# Patient Record
Sex: Male | Born: 1935 | Race: White | Hispanic: No | Marital: Married | Smoking: Former smoker
Health system: Southern US, Community
[De-identification: ages and names within clinical notes are randomized; demographics above are authoritative.]

## PROBLEM LIST (undated history)

## (undated) DIAGNOSIS — E785 Hyperlipidemia, unspecified: Secondary | ICD-10-CM

## (undated) DIAGNOSIS — S42213A Unspecified displaced fracture of surgical neck of unspecified humerus, initial encounter for closed fracture: Secondary | ICD-10-CM

## (undated) DIAGNOSIS — F0390 Unspecified dementia without behavioral disturbance: Secondary | ICD-10-CM

## (undated) DIAGNOSIS — C61 Malignant neoplasm of prostate: Secondary | ICD-10-CM

## (undated) DIAGNOSIS — N200 Calculus of kidney: Secondary | ICD-10-CM

## (undated) DIAGNOSIS — I2510 Atherosclerotic heart disease of native coronary artery without angina pectoris: Secondary | ICD-10-CM

## (undated) DIAGNOSIS — G309 Alzheimer's disease, unspecified: Secondary | ICD-10-CM

## (undated) DIAGNOSIS — I509 Heart failure, unspecified: Secondary | ICD-10-CM

## (undated) DIAGNOSIS — S7291XA Unspecified fracture of right femur, initial encounter for closed fracture: Secondary | ICD-10-CM

## (undated) DIAGNOSIS — F0280 Dementia in other diseases classified elsewhere without behavioral disturbance: Secondary | ICD-10-CM

## (undated) DIAGNOSIS — D649 Anemia, unspecified: Secondary | ICD-10-CM

## (undated) DIAGNOSIS — E164 Increased secretion of gastrin: Secondary | ICD-10-CM

## (undated) DIAGNOSIS — I10 Essential (primary) hypertension: Secondary | ICD-10-CM

## (undated) DIAGNOSIS — Z950 Presence of cardiac pacemaker: Secondary | ICD-10-CM

## (undated) HISTORY — DX: Hyperlipidemia, unspecified: E78.5

## (undated) HISTORY — DX: Anemia, unspecified: D64.9

## (undated) HISTORY — DX: Heart failure, unspecified: I50.9

## (undated) HISTORY — DX: Increased secretion of gastrin: E16.4

## (undated) HISTORY — PX: TONSILLECTOMY: SUR1361

## (undated) HISTORY — DX: Atherosclerotic heart disease of native coronary artery without angina pectoris: I25.10

## (undated) HISTORY — PX: JOINT REPLACEMENT: SHX530

## (undated) HISTORY — PX: EYE SURGERY: SHX253

## (undated) HISTORY — PX: HEMORRHOID SURGERY: SHX153

## (undated) HISTORY — DX: Malignant neoplasm of prostate: C61

## (undated) HISTORY — DX: Unspecified displaced fracture of surgical neck of unspecified humerus, initial encounter for closed fracture: S42.213A

## (undated) HISTORY — DX: Unspecified dementia, unspecified severity, without behavioral disturbance, psychotic disturbance, mood disturbance, and anxiety: F03.90

## (undated) HISTORY — PX: CATARACT EXTRACTION, BILATERAL: SHX1313

## (undated) HISTORY — PX: HERNIA REPAIR: SHX51

---

## 1989-05-10 HISTORY — PX: CORONARY ANGIOPLASTY: SHX604

## 1997-05-10 HISTORY — PX: CORONARY ARTERY BYPASS GRAFT: SHX141

## 1998-02-17 ENCOUNTER — Inpatient Hospital Stay (HOSPITAL_COMMUNITY): Admission: EM | Admit: 1998-02-17 | Discharge: 1998-02-23 | Payer: Self-pay | Admitting: Emergency Medicine

## 1998-02-18 ENCOUNTER — Encounter: Payer: Self-pay | Admitting: Cardiovascular Disease

## 1998-02-19 ENCOUNTER — Encounter: Payer: Self-pay | Admitting: Thoracic Surgery (Cardiothoracic Vascular Surgery)

## 1998-02-20 ENCOUNTER — Encounter: Payer: Self-pay | Admitting: Thoracic Surgery (Cardiothoracic Vascular Surgery)

## 1998-02-21 ENCOUNTER — Encounter: Payer: Self-pay | Admitting: Thoracic Surgery (Cardiothoracic Vascular Surgery)

## 1998-04-23 ENCOUNTER — Encounter (HOSPITAL_COMMUNITY): Admission: RE | Admit: 1998-04-23 | Discharge: 1998-07-24 | Payer: Self-pay | Admitting: Cardiovascular Disease

## 2000-12-29 ENCOUNTER — Encounter (INDEPENDENT_AMBULATORY_CARE_PROVIDER_SITE_OTHER): Payer: Self-pay | Admitting: Specialist

## 2000-12-29 ENCOUNTER — Ambulatory Visit (HOSPITAL_COMMUNITY): Admission: RE | Admit: 2000-12-29 | Discharge: 2000-12-29 | Payer: Self-pay | Admitting: Gastroenterology

## 2002-10-13 ENCOUNTER — Emergency Department (HOSPITAL_COMMUNITY): Admission: EM | Admit: 2002-10-13 | Discharge: 2002-10-13 | Payer: Self-pay | Admitting: Emergency Medicine

## 2004-08-05 ENCOUNTER — Encounter (HOSPITAL_COMMUNITY): Admission: RE | Admit: 2004-08-05 | Discharge: 2004-11-03 | Payer: Self-pay | Admitting: Urology

## 2004-09-07 HISTORY — PX: OTHER SURGICAL HISTORY: SHX169

## 2004-09-24 ENCOUNTER — Ambulatory Visit (HOSPITAL_COMMUNITY): Admission: RE | Admit: 2004-09-24 | Discharge: 2004-09-24 | Payer: Self-pay | Admitting: Urology

## 2004-12-27 ENCOUNTER — Inpatient Hospital Stay (HOSPITAL_COMMUNITY): Admission: EM | Admit: 2004-12-27 | Discharge: 2005-01-01 | Payer: Self-pay | Admitting: Emergency Medicine

## 2004-12-27 ENCOUNTER — Ambulatory Visit: Payer: Self-pay | Admitting: Physical Medicine & Rehabilitation

## 2004-12-27 ENCOUNTER — Encounter (INDEPENDENT_AMBULATORY_CARE_PROVIDER_SITE_OTHER): Payer: Self-pay | Admitting: Specialist

## 2005-05-10 DIAGNOSIS — F0280 Dementia in other diseases classified elsewhere without behavioral disturbance: Secondary | ICD-10-CM

## 2005-05-10 HISTORY — DX: Dementia in other diseases classified elsewhere, unspecified severity, without behavioral disturbance, psychotic disturbance, mood disturbance, and anxiety: F02.80

## 2006-06-10 HISTORY — PX: CARDIAC CATHETERIZATION: SHX172

## 2006-07-06 ENCOUNTER — Ambulatory Visit (HOSPITAL_COMMUNITY): Admission: RE | Admit: 2006-07-06 | Discharge: 2006-07-06 | Payer: Self-pay | Admitting: Urology

## 2007-01-09 ENCOUNTER — Inpatient Hospital Stay (HOSPITAL_COMMUNITY): Admission: EM | Admit: 2007-01-09 | Discharge: 2007-01-13 | Payer: Self-pay | Admitting: Emergency Medicine

## 2007-01-09 HISTORY — PX: INSERT / REPLACE / REMOVE PACEMAKER: SUR710

## 2007-01-10 DIAGNOSIS — Z950 Presence of cardiac pacemaker: Secondary | ICD-10-CM

## 2007-01-10 HISTORY — DX: Presence of cardiac pacemaker: Z95.0

## 2007-11-07 HISTORY — PX: NM MYOCAR PERF WALL MOTION: HXRAD629

## 2010-05-08 ENCOUNTER — Observation Stay (HOSPITAL_COMMUNITY)
Admission: EM | Admit: 2010-05-08 | Discharge: 2010-05-09 | Payer: Self-pay | Source: Home / Self Care | Attending: Cardiovascular Disease | Admitting: Cardiovascular Disease

## 2010-06-05 NOTE — Discharge Summary (Signed)
  NAMEVINCE, AINSLEY NO.:  [id number]  MEDICAL RECORD NO.:  [id number]          PATIENT TYPE:  OBV  LOCATION:  2010                         FACILITY:  [facility]  PHYSICIAN:  Richard A. Alanda Amass, M.D.DATE OF BIRTH:  01/23/1919  DATE OF ADMISSION:  05/08/2010 DATE OF DISCHARGE:  05/09/2010                              DISCHARGE SUMMARY   DISCHARGE DIAGNOSES: 1. Dizziness of unclear etiology. 2. Known coronary artery disease with remote DMI in 1991, bypass     grafting in 1999, catheterization in September 2008, plan is for     medical therapy. 3. Known ischemic cardiomyopathy with an ejection fraction of 35% in     September 2008. 4. Complete heart block and bradycardia, status post Medtronic     pacemaker implant in September 2008. 5. History of prostate cancer. 6. Dementia.  HOSPITAL COURSE:  The patient is a pleasant 77 year old male followed by Dr. Alanda Amass.  He has coronary artery disease as described above.  In September 2008, e had a pacemaker and a cath.  He has actually done quite well and not been hospitalized since then.  He was at home and had a swimmy-headed episode.  There are no specific complaints, but his family was concern is that it was the same reason he came in September 2008.  He also admitted he has had a fall or two at home.  He is admitted to the emergency room, CT scan of his head was obtained as well as enzymes.  These were all negative.  By that time we were asked to see him at 11:30 p.m., it was decided to be best to just keep him overnight for observation and check his pacemaker in the morning.  His enzymes have been negative and has not had any further episodes.  His blood pressure is stable.  We will check his pacemaker today and discharge him later if that checks out okay.  He can follow up with Dr. Alanda Amass in a couple of weeks.  LABORATORY DATA:  White count 6.0, hemoglobin 13, hematocrit 38.7, and platelets 192.   Troponin negative x2.  Sodium 140, potassium 4.4, BUN 15, and creatinine 1.2.  LFTs were normal.  TSH is 2.3.  INR 0.9. Urinalysis unremarkable.  Chest x-ray shows no acute process.  CT shows no acute intracranial abnormality with mild small vessel change.  EKG shows paced rhythm.  DISPOSITION:  The patient will be discharged later today pending his pacemaker evaluation.     Abelino Derrick, P.A.   ______________________________ Pearletha Furl Alanda Amass, M.D.   Lenard Lance  D:  05/09/2010  T:  05/10/2010  Job:  [id number]  Electronically Signed by Corine Shelter P.A. on 05/13/2010 04:02:03 PM Electronically Signed by Susa Griffins M.D. on 06/03/2010 12:47:11 PM

## 2010-06-05 NOTE — H&P (Signed)
NAMEAKIO, HUDNALL NO.:  [id number]  MEDICAL RECORD NO.:  [id number]          PATIENT TYPE:  EMS  LOCATION:  MAJO                         FACILITY:  [facility]  PHYSICIAN:  Shenelle Klas A. Alanda Amass, M.D.DATE OF BIRTH:  1919-01-26  DATE OF ADMISSION:  05/08/2010 DATE OF DISCHARGE:                             HISTORY & PHYSICAL   CHIEF COMPLAINT:  Dizziness.  HISTORY OF PRESENT ILLNESS:  Mr. Mussa is an 77 year old male followed by Dr. Alanda Amass with a history of coronary disease.  He had a DMI treated with an angioplasty at [facility] in 1991.  He had bypass grafting in 1999.  He was admitted in September 2008 and catheterized.  At that time he had an occluded vein graft to the diagonal, patent LIMA to the LAD, and a patent SVG to the RCA.  His EF was 35% at that time.  He had dizziness and transient AV block during that admission and underwent a Medtronic pacemaker implant.  He has not been hospitalized since.  His family says he has had a couple of falls at home recently.  This evening his family said he was "swimmy-headed." They brought him to the emergency room for further evaluation.  He has not had chest pain, palpitations, or frank syncope.  He is admitted now for overnight observation and to evaluate his pacemaker.  MEDICATIONS:  His home medications include the following: 1. Aspirin daily. 2. Fish oil daily. 3. Metoprolol 12.5 mg b.i.d. 4. Namenda 10 mg a day. 5. Nexium 40 mg a day. 6. Zocor 40 mg a day. 7. Aldactone 25 mg a day.  ALLERGIES:  He has no known drug allergies.  PAST MEDICAL/SURGICAL HISTORY:  His past medical history is remarkable for coronary disease, as described above, as well as a pacemaker implant in September 2008, as described above.  He has a remote history of prostate cancer.  He has dementia and is an unreliable historian.  He has had a previous left total hip replacement in August 2006.  SOCIAL HISTORY:  He is  married.  He has 3 children and 8 grandchildren. He is living at home with his family.  FAMILY HISTORY:  Unremarkable.  REVIEW OF SYSTEMS:  Essentially unremarkable except for as noted above. The family says he has not been ill lately, and there have been no obvious fever or chills.  He has not had melena or seizures.  PHYSICAL EXAM:  VITAL SIGNS:  In the emergency room blood pressure is 155/95, pulse 70 and paced, respirations 12, temperature 98.7. GENERAL:  He is a well-developed, well-nourished elderly male, in no acute distress. HEENT:  Normocephalic.  Extraocular movements are intact.  Sclerae are nonicteric.  He wears glasses. NECK:  Without JVD or bruit. CHEST:  Clear to auscultation and percussion. CARDIAC:  Reveals regular rate and rhythm.  He does have a 2/6 systolic murmur, heard loudest along the left sternal border. ABDOMEN:  Abdomen is not tender, not distended. EXTREMITIES:  Without obvious edema.  Distal pulses are faint, but intact. NEURO:  Exam is grossly intact.  His strength is equal and symmetrical. SKIN:  Skin is cool and dry.  LABORATORY DATA:  White count 5.8, hemoglobin 12.7, hematocrit 38.5, platelets 187,000.  Sodium 139, potassium 4.3, BUN 16, creatinine 1.24, INR 0.9.  Troponins are negative x2.  Urinalysis is unremarkable.  Head CT is negative.  His EKG shows a paced rhythm.  IMPRESSION: 1. Dizziness. 2. Known coronary disease with remote DMI in 1991, bypass in 1999,     catheterization in September 2008, showing a vein graft to the     diagonal to be occluded, but a patent left internal mammary artery     (LIMA) to the left anterior descending artery (LAD), and a patent     vein graft to the right. 3. Known ischemic cardiomyopathy, his last ejection fraction was 35%     in September 2008. 4. Complete heart block and bradycardia status post Medtronic     pacemaker implant in September 2008. 5. Remote prostate cancer. 6. Dementia.  PLAN:  The  patient will be admitted for observation.  We will interrogate his pacemaker in the morning.     Abelino Derrick, P.A.   ______________________________ Pearletha Furl Alanda Amass, M.D.    Lenard Lance  D:  05/08/2010  T:  05/08/2010  Job:  [id number]  Electronically Signed by Corine Shelter P.A. on 05/13/2010 04:01:56 PM Electronically Signed by Susa Griffins M.D. on 06/03/2010 12:47:13 PM

## 2010-07-20 LAB — DIFFERENTIAL
Basophils Absolute: 0 10*3/uL (ref 0.0–0.1)
Eosinophils Relative: 4 % (ref 0–5)
Lymphocytes Relative: 25 % (ref 12–46)
Lymphs Abs: 1.2 10*3/uL (ref 0.7–4.0)
Monocytes Absolute: 0.6 10*3/uL (ref 0.1–1.0)
Monocytes Absolute: 0.6 10*3/uL (ref 0.1–1.0)
Monocytes Relative: 10 % (ref 3–12)
Neutro Abs: 3.7 10*3/uL (ref 1.7–7.7)
Neutro Abs: 3.8 10*3/uL (ref 1.7–7.7)
Neutrophils Relative %: 61 % (ref 43–77)
Neutrophils Relative %: 66 % (ref 43–77)

## 2010-07-20 LAB — URINE CULTURE: Culture: NO GROWTH

## 2010-07-20 LAB — COMPREHENSIVE METABOLIC PANEL
AST: 17 U/L (ref 0–37)
Albumin: 3.4 g/dL — ABNORMAL LOW (ref 3.5–5.2)
Alkaline Phosphatase: 68 U/L (ref 39–117)
BUN: 15 mg/dL (ref 6–23)
BUN: 16 mg/dL (ref 6–23)
CO2: 27 mEq/L (ref 19–32)
Calcium: 8.8 mg/dL (ref 8.4–10.5)
Calcium: 9.2 mg/dL (ref 8.4–10.5)
Chloride: 107 mEq/L (ref 96–112)
Creatinine, Ser: 1.24 mg/dL (ref 0.4–1.5)
GFR calc Af Amer: >60 mL/min (ref >60)
GFR calc non Af Amer: 55 mL/min — ABNORMAL LOW (ref >60)
Glucose, Bld: 113 mg/dL — ABNORMAL HIGH (ref 70–99)
Glucose, Bld: 113 mg/dL — ABNORMAL HIGH (ref 70–99)
Potassium: 4.4 mEq/L (ref 3.5–5.1)
Sodium: 140 mEq/L (ref 135–145)
Total Protein: 6 g/dL (ref 6.0–8.3)

## 2010-07-20 LAB — CARDIAC PANEL(CRET KIN+CKTOT+MB+TROPI)
CK, MB: 1.4 ng/mL (ref 0.3–4.0)
Relative Index: INVALID (ref 0.0–2.5)
Troponin I: 0.01 ng/mL (ref 0.00–0.06)

## 2010-07-20 LAB — URINALYSIS, ROUTINE W REFLEX MICROSCOPIC
Bilirubin Urine: NEGATIVE
Nitrite: NEGATIVE
Specific Gravity, Urine: 1.017 (ref 1.005–1.030)
pH: 6.5 (ref 5.0–8.0)

## 2010-07-20 LAB — POCT CARDIAC MARKERS
CKMB, poc: <1 ng/mL — ABNORMAL LOW (ref 1.0–8.0)
CKMB, poc: <1 ng/mL — ABNORMAL LOW (ref 1.0–8.0)

## 2010-07-20 LAB — CBC
HCT: 38.5 % — ABNORMAL LOW (ref 39.0–52.0)
Hemoglobin: 12.7 g/dL — ABNORMAL LOW (ref 13.0–17.0)
Hemoglobin: 13 g/dL (ref 13.0–17.0)
MCHC: 33.6 g/dL (ref 30.0–36.0)
MCV: 93.7 fL (ref 78.0–100.0)
MCV: 94.1 fL (ref 78.0–100.0)
Platelets: 187 10*3/uL (ref 150–400)
RBC: 4.09 MIL/uL — ABNORMAL LOW (ref 4.22–5.81)
RDW: 13.4 % (ref 11.5–15.5)

## 2010-07-20 LAB — PROTIME-INR
INR: 0.93 (ref 0.00–1.49)
Prothrombin Time: 12.7 seconds (ref 11.6–15.2)

## 2010-07-20 LAB — APTT: aPTT: 31 seconds (ref 24–37)

## 2010-07-25 ENCOUNTER — Emergency Department (HOSPITAL_COMMUNITY): Payer: Medicare Other

## 2010-07-25 ENCOUNTER — Inpatient Hospital Stay (HOSPITAL_COMMUNITY)
Admission: EM | Admit: 2010-07-25 | Discharge: 2010-07-28 | DRG: 470 | Disposition: A | Discharge status: Skilled Nursing Facility | Payer: Medicare Other | Attending: Internal Medicine | Admitting: Internal Medicine

## 2010-07-25 DIAGNOSIS — K219 Gastro-esophageal reflux disease without esophagitis: Secondary | ICD-10-CM | POA: Diagnosis present

## 2010-07-25 DIAGNOSIS — M1990 Unspecified osteoarthritis, unspecified site: Secondary | ICD-10-CM | POA: Diagnosis present

## 2010-07-25 DIAGNOSIS — I10 Essential (primary) hypertension: Secondary | ICD-10-CM | POA: Diagnosis present

## 2010-07-25 DIAGNOSIS — Y92009 Unspecified place in unspecified non-institutional (private) residence as the place of occurrence of the external cause: Secondary | ICD-10-CM

## 2010-07-25 DIAGNOSIS — E785 Hyperlipidemia, unspecified: Secondary | ICD-10-CM | POA: Diagnosis present

## 2010-07-25 DIAGNOSIS — W19XXXA Unspecified fall, initial encounter: Secondary | ICD-10-CM | POA: Diagnosis present

## 2010-07-25 DIAGNOSIS — Z8546 Personal history of malignant neoplasm of prostate: Secondary | ICD-10-CM

## 2010-07-25 DIAGNOSIS — Z01818 Encounter for other preprocedural examination: Secondary | ICD-10-CM

## 2010-07-25 DIAGNOSIS — I509 Heart failure, unspecified: Secondary | ICD-10-CM | POA: Diagnosis present

## 2010-07-25 DIAGNOSIS — I252 Old myocardial infarction: Secondary | ICD-10-CM

## 2010-07-25 DIAGNOSIS — I5022 Chronic systolic (congestive) heart failure: Secondary | ICD-10-CM | POA: Diagnosis present

## 2010-07-25 DIAGNOSIS — Z7982 Long term (current) use of aspirin: Secondary | ICD-10-CM

## 2010-07-25 DIAGNOSIS — F0390 Unspecified dementia without behavioral disturbance: Secondary | ICD-10-CM | POA: Diagnosis present

## 2010-07-25 DIAGNOSIS — I2510 Atherosclerotic heart disease of native coronary artery without angina pectoris: Secondary | ICD-10-CM | POA: Diagnosis present

## 2010-07-25 DIAGNOSIS — E164 Increased secretion of gastrin: Secondary | ICD-10-CM | POA: Diagnosis present

## 2010-07-25 DIAGNOSIS — Z9181 History of falling: Secondary | ICD-10-CM

## 2010-07-25 DIAGNOSIS — Z950 Presence of cardiac pacemaker: Secondary | ICD-10-CM

## 2010-07-25 DIAGNOSIS — S72009A Fracture of unspecified part of neck of unspecified femur, initial encounter for closed fracture: Principal | ICD-10-CM | POA: Diagnosis present

## 2010-07-25 DIAGNOSIS — Z951 Presence of aortocoronary bypass graft: Secondary | ICD-10-CM

## 2010-07-25 HISTORY — PX: OTHER SURGICAL HISTORY: SHX169

## 2010-07-25 LAB — CBC
MCH: 32.6 pg (ref 26.0–34.0)
MCHC: 35 g/dL (ref 30.0–36.0)
MCV: 93.1 fL (ref 78.0–100.0)
Platelets: 160 10*3/uL (ref 150–400)
RDW: 13.6 % (ref 11.5–15.5)

## 2010-07-25 LAB — POCT I-STAT, CHEM 8
Calcium, Ion: 1.15 mmol/L (ref 1.12–1.32)
Creatinine, Ser: 1.2 mg/dL (ref 0.4–1.5)
Glucose, Bld: 124 mg/dL — ABNORMAL HIGH (ref 70–99)
Hemoglobin: 14.6 g/dL (ref 13.0–17.0)
Sodium: 139 mEq/L (ref 135–145)
TCO2: 25 mmol/L (ref 0–100)

## 2010-07-25 LAB — URINALYSIS, ROUTINE W REFLEX MICROSCOPIC
Ketones, ur: 15 mg/dL — AB
Nitrite: NEGATIVE
Specific Gravity, Urine: 1.014 (ref 1.005–1.030)
Urobilinogen, UA: 1 mg/dL (ref 0.0–1.0)
pH: 6.5 (ref 5.0–8.0)

## 2010-07-25 LAB — POCT CARDIAC MARKERS

## 2010-07-25 LAB — MRSA PCR SCREENING: MRSA by PCR: NEGATIVE

## 2010-07-25 LAB — TYPE AND SCREEN

## 2010-07-25 LAB — ABO/RH: ABO/RH(D): A POS

## 2010-07-25 LAB — PROTIME-INR: INR: 0.94 (ref 0.00–1.49)

## 2010-07-26 LAB — CBC
HCT: 34.6 % — ABNORMAL LOW (ref 39.0–52.0)
MCHC: 34.1 g/dL (ref 30.0–36.0)
MCV: 93.8 fL (ref 78.0–100.0)
Platelets: 149 10*3/uL — ABNORMAL LOW (ref 150–400)
RDW: 13.7 % (ref 11.5–15.5)
WBC: 11.4 10*3/uL — ABNORMAL HIGH (ref 4.0–10.5)

## 2010-07-26 LAB — DIFFERENTIAL
Eosinophils Absolute: 0 10*3/uL (ref 0.0–0.7)
Eosinophils Relative: 0 % (ref 0–5)
Lymphocytes Relative: 6 % — ABNORMAL LOW (ref 12–46)
Lymphs Abs: 0.7 10*3/uL (ref 0.7–4.0)
Monocytes Absolute: 1.2 10*3/uL — ABNORMAL HIGH (ref 0.1–1.0)

## 2010-07-26 LAB — COMPREHENSIVE METABOLIC PANEL
Albumin: 3 g/dL — ABNORMAL LOW (ref 3.5–5.2)
BUN: 12 mg/dL (ref 6–23)
Calcium: 8.5 mg/dL (ref 8.4–10.5)
Creatinine, Ser: 0.97 mg/dL (ref 0.4–1.5)
Glucose, Bld: 110 mg/dL — ABNORMAL HIGH (ref 70–99)
Potassium: 4 mEq/L (ref 3.5–5.1)
Total Protein: 5.3 g/dL — ABNORMAL LOW (ref 6.0–8.3)

## 2010-07-26 LAB — MAGNESIUM: Magnesium: 1.8 mg/dL (ref 1.5–2.5)

## 2010-07-26 LAB — PHOSPHORUS: Phosphorus: 3.3 mg/dL (ref 2.3–4.6)

## 2010-07-27 LAB — CBC
HCT: 30.9 % — ABNORMAL LOW (ref 39.0–52.0)
Hemoglobin: 10.6 g/dL — ABNORMAL LOW (ref 13.0–17.0)
MCHC: 34.3 g/dL (ref 30.0–36.0)
RBC: 3.3 MIL/uL — ABNORMAL LOW (ref 4.22–5.81)
WBC: 9.5 10*3/uL (ref 4.0–10.5)

## 2010-07-27 LAB — BASIC METABOLIC PANEL
CO2: 21 mEq/L (ref 19–32)
Calcium: 8.2 mg/dL — ABNORMAL LOW (ref 8.4–10.5)
Chloride: 105 mEq/L (ref 96–112)
GFR calc Af Amer: >60 mL/min (ref >60)
Glucose, Bld: 107 mg/dL — ABNORMAL HIGH (ref 70–99)
Potassium: 4.5 mEq/L (ref 3.5–5.1)
Sodium: 133 mEq/L — ABNORMAL LOW (ref 135–145)

## 2010-07-28 LAB — BASIC METABOLIC PANEL
CO2: 25 mEq/L (ref 19–32)
Chloride: 104 mEq/L (ref 96–112)
Creatinine, Ser: 1.07 mg/dL (ref 0.4–1.5)
GFR calc Af Amer: >60 mL/min (ref >60)
Glucose, Bld: 99 mg/dL (ref 70–99)

## 2010-07-28 LAB — CBC
HCT: 31.6 % — ABNORMAL LOW (ref 39.0–52.0)
Hemoglobin: 10.7 g/dL — ABNORMAL LOW (ref 13.0–17.0)
MCH: 31.5 pg (ref 26.0–34.0)
RBC: 3.4 MIL/uL — ABNORMAL LOW (ref 4.22–5.81)

## 2010-07-30 NOTE — Discharge Summary (Signed)
Douglas Tucker, COCHRANE NO.:  [id number]  MEDICAL RECORD NO.:  [id number]           PATIENT TYPE:  I  LOCATION:  4703                         FACILITY:  [facility]  PHYSICIAN:  Jeoffrey Massed, MD    DATE OF BIRTH:  1919/01/30  DATE OF ADMISSION:  07/25/2010 DATE OF DISCHARGE:                        DISCHARGE SUMMARY - REFERRING   PRIMARY CARE PRACTITIONER:  Kenlee Maler in [place].  PRIMARY CARDIOLOGIST:  Richard A. Alanda Amass, MD of [place].  PRIMARY DISCHARGE DIAGNOSIS:  Right displaced femoral neck fracture, status post right hip hemiarthroplasty.  SECONDARY DISCHARGE DIAGNOSES: 1. Systolic heart failure, compensated. 2. Hypertension. 3. Dementia. 4. History of prostate CA, status post seed implant. 5. Gastroesophageal reflux disease. 6. History of Zollinger-Ellison syndrome. 7. History of pacemaker for complete heart block.  DISCHARGE MEDICATIONS: 1. Ferrous sulfate 325 mg 1 tablet p.o. 3 times a day. 2. Norco 5/325 1/2-1 tablet p.o. q.6 h p.r.n. 3. Robaxin 500 mg 1 tablet p.o. q.6 h p.r.n. 4. Aspirin 81 mg 1 tablet p.o. daily. 5. Fish oil over-the-counter 1 capsule p.o. twice daily. 6. Metoprolol 25 mg 1 tablet p.o. twice daily. 7. Namenda 10 mg 1 tablet p.o. daily. 8. Nexium 40 mg 1 capsule p.o. daily. 9. Zocor 40 mg 1 tablet p.o. daily. 10.Aldactone 25 mg half a tab p.o. every morning.  CONSULTATIONS: 1. [facility]. 2. Almedia Balls. Ranell Patrick, MD from orthopedics.  PERTINENT RADIOLOGICAL STUDIES: 1. CT of the right hip done on July 25, 2010 showed a displaced right     femoral neck fracture. 2. CT of the head showed no acute intracranial abnormality. 3. CT of the cervical spine showed cervical spondylosis with no acute     abnormality.  PERTINENT LABORATORY DATA: 1. Last CBC shows WBC of 8.5, hemoglobin of 10.7 and a platelet count     of 138. 2. Chemistry shows a sodium of 132, potassium of 4.8, chloride of 104,  bicarb of 25, glucose of 99, BUN of 15, creatinine of 1.07 and     calcium of 8.4.  BRIEF HOSPITAL COURSE: 1. Right hip fracture, status post right hip hemiarthroplasty.  The     patient presented with a mechanical fall which was not a syncopal     episode.  Given his multiple medical comorbidities, the patient was     admitted to the hospital service.  Cardiology was consulted for     preoperative consultation, and they thought that the patient was     currently optimized to undergo surgery.  Subsequently, the patient     underwent surgery on July 25, 2010 and did well postoperatively.     Current plans are to use Norco for pain control on a p.r.n. basis     and the patient to be transferred to a skilled nursing facility for     further rehab.  Currently, we are awaiting a bed. 2. Congestive heart failure.  This is compensated.  The patient will     be continued on his metoprolol and Aldactone on discharge. 3. History of gastroesophageal reflux disease and Zollinger-Ellison     syndrome.  The  patient will be continued on Nexium on discharge. 4. History of coronary artery disease.  The patient will continue     simvastatin, aspirin along with metoprolol. 5. Dementia.  This is at baseline.  He will continue with Namenda.  DISPOSITION:  Currently, we are awaiting a bed to be available at a skilled nursing facility for the patient to be discharged there.  FOLLOWUP INSTRUCTIONS: 1. The patient to follow up with his primary care practitioner within     1 week upon discharge from the skilled nursing facility. 2. The patient to follow up with [facility]     within 1-2 weeks upon discharge. 3. The patient to follow up with Orthopedics within 1-2 weeks upon     discharge.  Total time spent 45 minutes.     Jeoffrey Massed, MD     SG/MEDQ  D:  07/28/2010  T:  07/28/2010  Job:  [id number]  cc:   Almedia Balls. Ranell Patrick, M.D. Jayke Slatosky Richard A. Alanda Amass,  M.D.  Electronically Signed by Jeoffrey Massed  on 07/30/2010 08:38:18 PM

## 2010-08-11 NOTE — H&P (Signed)
[name], Tucker NO.:  [id number]  MEDICAL RECORD NO.:  [id number]           PATIENT TYPE:  E  LOCATION:  [facility]                         FACILITY:  [facility]  PHYSICIAN:  Rock Nephew, MD       DATE OF BIRTH:  Jul 04, 1918  DATE OF ADMISSION:  07/25/2010 DATE OF DISCHARGE:                             HISTORY & PHYSICAL   PRIMARY CARE PHYSICIAN:  Dr. Cheri Rous in [place].  PRIMARY CARDIOLOGIST:  Richard A. Alanda Amass, MD of [facility].  CHIEF COMPLAINT:  Fall, right hip fracture.  HISTORY OF PRESENT ILLNESS:  This is an 77 year old male with a history of coronary artery disease, pacemaker insertion for complete heart block, hypertension, dementia, prostate cancer, Zollinger-Ellison syndrome, and hyperlipidemia.  The patient's family is present and wife also, Douglas Tucker [phone], who is the healthcare power of attorney. The patient has been falling recently.  The patient fell at his home at 3 a.m. in the bathroom.  The patient and the family denied syncopal episode.  The patient has denied any chest pain or any shortness of breath.  The patient is alert, awake, and oriented to self and place. The patient currently is not in any pain.  He is resting comfortably in bed.  The patient has been seen at [facility], Dr. Royann Shivers in consultation.  PAST MEDICAL HISTORY: 1. Hypertension. 2. Prostate cancer.  Family reports that the patient has had     radioactive seeds. 3. Dementia. 4. GERD. 5. Zollinger-Ellison syndrome. 6. History of hypercholesterolemia. 7. History of MI. 8. Systolic CHF with ejection fraction of 45-50% on February 2011.  PAST SURGICAL HISTORY:  CABG and pacemaker for complete heart block.  SOCIAL HISTORY:  Nonsmoker, nondrinker.  No drug abuse.  The patient lives with his wife.  He ambulates with the help of a walker and cane.  ALLERGIES:  No known drug allergies.  REVIEW OF SYSTEMS:   Unable to obtain fully, however the patient denies any chest pain, any shortness of breath, any headaches, or any pain.  MEDICATIONS: 1. Metoprolol 25 mg p.o. b.i.d. 2. Nexium 40 mg p.o. daily. 3. Simvastatin 40 mg p.o. daily. 4. Fish oil, unknown how often. 5. Aspirin 81 mg p.o. daily. 6. Aldactone 12.5 mg p.o. daily. 7. Namenda 10 mg every other day daily.  PHYSICAL EXAMINATION:  VITAL SIGNS:  Temperature 97.8, blood pressure currently is 135/84, pulse is 73, respiratory rate is 20, and 95% saturation on room air. HEAD, EYES, EARS, NOSE AND THROAT:  Normocephalic and atraumatic. Pupils are equally round and reactive to light. CARDIOVASCULAR:  Regular rate and rhythm.  No murmurs or rubs. LUNGS:  Clear to auscultation bilaterally.  No wheezes or rhonchi. ABDOMEN:  Soft, nontender, nondistended.  Bowel sounds are positive.  No guarding or rebound tenderness. EXTREMITIES:  No lower extremity edema.  The patient had some right hip pain on palpation.  The patient is able to move all extremities. NEUROLOGIC:  He has no neurological deficits, recently moving the right leg is causing pain.  IMAGING DATA:  The patient's 12-lead EKG shows a wide complex  nonspecific rhythm that is labeled as Afib.  RADIOLOGICAL STUDIES:  The patient had a chest x-ray one-view, which shows stable hyperinflation of the lung without acute findings, right hip x-ray shows displaced right femoral neck fracture.  CT of the C- spine shows cervical spondylolysis, no acute bone abnormality.  CT of the head shows cervical spondylolysis, no acute bony abnormalities.  LABORATORY STUDIES:  WBC count 12.7, hemoglobin 14.6, hematocrit 43.0, MCV 93.1, and platelets 160.  INR 0.94.  Sodium 139, potassium 4.5, chloride 104, bicarbonate 25, BUN 15, creatinine 1.2.  Troponin-I point- of-care marker was 0.05.  Urinalysis; negative leukocyte esterase and negative nitrites.  IMPRESSION AND PLAN:  This is an 77 year old male  with multiple medical problems admitted for right hip femoral neck fracture.  1. Right hip femoral neck fracture.  The patient is seen in     consultation with [facility], Dr. Royann Shivers.     [facility] has determined the risks of     delaying surgery is greater than the risk of undergoing surgery.     The patient will get a preop beta-blocker prior to the surgery.     The patient will be n.p.o. except for meds.  I have spoke to Dr.     Malon Kindle, he will proceed with the surgery today. 2. History of pacemaker complete heart block.  The pacer is currently     being interrogated. 3. History of coronary artery disease.  Currently, the patient's     aspirin is going to be withheld.  The patient will be continued on     the beta-blocker. 4. History of systolic congestive heart failure, chronic.  We will     monitor the patient, we will get gentle IV fluids.  We will watch     the patient for volume overload.  The patient currently is     receiving low dose Aldactone.  The patient is not on an ACE     inhibitor right now. 5. History of Zollinger-Ellison syndrome and gastroesophageal reflux     disease.  The patient will be continued on Nexium. 6. History of hypertension.  The patient will be continued on home     antihypertensives except for Aldactone. 7. History of hyperlipidemia.  The patient will be continued on     statins. 8. DVT prophylaxis.  The patient will be placed on SCDs. 9. History of prostate cancer.  The patient has prostate cancer seeds.     The patient should follow up outpatient with [facility] to     figure out if these seeds need to be removed. 10.The patient is a full code.     Rock Nephew, MD     NH/MEDQ  D:  07/25/2010  T:  07/25/2010  Job:  [id number]  cc:   Cheri Rous Richard A. Alanda Amass, M.D.  Electronically Signed by Rock Nephew MD on 08/11/2010 05:59:42 PM

## 2010-08-12 NOTE — Op Note (Signed)
Douglas Tucker, Douglas Tucker NO.:  [id number]  MEDICAL RECORD NO.:  [id number]           PATIENT TYPE:  I  LOCATION:  3311                         FACILITY:  [facility]  PHYSICIAN:  Almedia Balls. Ranell Patrick, M.D. DATE OF BIRTH:  01/02/1919  DATE OF PROCEDURE:  07/25/2010 DATE OF DISCHARGE:                              OPERATIVE REPORT   PREOPERATIVE DIAGNOSIS:  Right displaced femoral neck fracture.  POSTOPERATIVE DIAGNOSIS:  Right displaced femoral neck fracture.  PROCEDURE PERFORMED:  Right hip hemiarthroplasty using DePuy Tri-Lock system.  ATTENDING SURGEON:  Almedia Balls. Ranell Patrick, M.D.  ASSISTANT:  Modesto Charon, [facility]  ANESTHESIA:  Spinal anesthesia was used.  ESTIMATED BLOOD LOSS:  150 mL.  FLUID REPLACEMENT:  750 mL.  URINE OUTPUT:  400 mL.  INSTRUMENT COUNTS:  Correct.  There were no complications.  Perioperative antibiotics were given.  INDICATIONS:  The patient is an 77 year old limited household ambulator, who sustained a ground-level fall.  The patient was unable to ambulate after falling and presented to the [facility] emergency room where x- rays demonstrated a displaced femoral neck fracture.  The patient has had a prior left hip hemiarthroplasty done years ago by Dr. Rennis Chris in our Practice, so family requested our Practice and a consult was made for Orthopedics for evaluation and treatment of the hip.  I discussed with the patient and the family options for treatment surgery being the best option to restore the patient's preinjury level of activity and independence.  The patient's family did consent to surgery.  Risks and benefits of surgery discussed.  Informed consent was obtained.  DESCRIPTION OF PROCEDURE:  After an adequate level of anesthesia was achieved, the patient was positioned in the left lateral decubitus position with the right hip up.  The right hip was sterilely prepped and draped in usual manner after padding the down leg and placing  an axillary roll and checking all neurovascular structures.  We then went ahead and sterilely prepped and draped the right hip and leg in the usual manner.  We did a Kocher-Langenbeck posterior approach to the hip starting at the vastus ridge and extending about 8-10 cm ending towards the gluteus maximus.  Dissection down through the subcutaneous tissues using Bovie.  The tensor fascia lata was divided in line with a skin incision.  Gluteus medius was retracted anteriorly.  The short external rotators were divided sharply off the posterior aspect of the femur using Bovie including piriformis.  We then entered the posterior hip capsule and divided that, the hip fracture was identified did not appear pathologic.  We went ahead and cut the femoral neck using the neck resection guide one fingerbreadth above the lesser trochanter.  At this point, we went ahead and removed the femoral head and sized it to a 52- mm diameter.  We removed the ligamentum teres and some anterior capsule of bone attached to it.  At this point, we thoroughly irrigated the socket.  We then prepared the femur first with a cookie cutter out laterally and then a star reamer for removing lateral bone.  We then used a canal finder and  then sequential broaching up to a size 9, a DePuy Tri-Lock Press-Fit stem.  We then with a size 9 in place with appropriate anteversion we went ahead and trialed with a 0 neck and a 52 head ball, reduced the hip, had symmetric leg lengths, very stable hip. No impingement in extension.  Nice flexion stability with internal rotation.  At this point, we went ahead and removed the trial components.  We inserted using a Press-Fit technique the Tri-Lock 9 stem with appropriate anteversion and impacted that into place.  We then went ahead and placed our 0 neck adapter and the 52 monopolar head and impacted that and placed the neck head assembly on the trunnion impacting that in position we then  reduced the hip and we are again happy with leg lengths and hip stability.  We thoroughly irrigated, closed the hip capsule with #1 Mersilene suture, and then we did a #1 Vicryl figure-of-eight interrupted closure of the tensor fascia lata. At this point, we went ahead and closed subcu with 2-0 Vicryl and 4-0 Monocryl for skin.  Steri-Strips and sterile dressing were applied.  The patient tolerated procedure well.     Almedia Balls. Ranell Patrick, M.D.     SRN/MEDQ  D:  07/25/2010  T:  07/26/2010  Job:  [id number]  Electronically Signed by Malon Kindle  on 08/12/2010 08:08:16 PM

## 2010-09-10 ENCOUNTER — Emergency Department (HOSPITAL_COMMUNITY): Payer: Medicare Other

## 2010-09-10 ENCOUNTER — Inpatient Hospital Stay (HOSPITAL_COMMUNITY)
Admission: EM | Admit: 2010-09-10 | Discharge: 2010-09-15 | DRG: 563 | Disposition: A | Discharge status: Skilled Nursing Facility | Payer: Medicare Other | Attending: Internal Medicine | Admitting: Internal Medicine

## 2010-09-10 DIAGNOSIS — W19XXXA Unspecified fall, initial encounter: Secondary | ICD-10-CM | POA: Diagnosis present

## 2010-09-10 DIAGNOSIS — D649 Anemia, unspecified: Secondary | ICD-10-CM | POA: Diagnosis present

## 2010-09-10 DIAGNOSIS — S42213A Unspecified displaced fracture of surgical neck of unspecified humerus, initial encounter for closed fracture: Principal | ICD-10-CM | POA: Diagnosis present

## 2010-09-10 DIAGNOSIS — E871 Hypo-osmolality and hyponatremia: Secondary | ICD-10-CM | POA: Diagnosis present

## 2010-09-10 DIAGNOSIS — N390 Urinary tract infection, site not specified: Secondary | ICD-10-CM | POA: Diagnosis present

## 2010-09-10 DIAGNOSIS — E785 Hyperlipidemia, unspecified: Secondary | ICD-10-CM | POA: Diagnosis present

## 2010-09-10 DIAGNOSIS — C61 Malignant neoplasm of prostate: Secondary | ICD-10-CM | POA: Diagnosis present

## 2010-09-10 DIAGNOSIS — A498 Other bacterial infections of unspecified site: Secondary | ICD-10-CM | POA: Diagnosis present

## 2010-09-10 DIAGNOSIS — R55 Syncope and collapse: Secondary | ICD-10-CM | POA: Diagnosis present

## 2010-09-10 DIAGNOSIS — I5022 Chronic systolic (congestive) heart failure: Secondary | ICD-10-CM | POA: Diagnosis present

## 2010-09-10 DIAGNOSIS — I509 Heart failure, unspecified: Secondary | ICD-10-CM | POA: Diagnosis present

## 2010-09-10 DIAGNOSIS — E164 Increased secretion of gastrin: Secondary | ICD-10-CM | POA: Diagnosis present

## 2010-09-10 LAB — COMPREHENSIVE METABOLIC PANEL
BUN: 20 mg/dL (ref 6–23)
Calcium: 9.1 mg/dL (ref 8.4–10.5)
Glucose, Bld: 94 mg/dL (ref 70–99)
Total Protein: 6 g/dL (ref 6.0–8.3)

## 2010-09-10 LAB — FERRITIN: Ferritin: 139 ng/mL (ref 22–322)

## 2010-09-10 LAB — CBC
HCT: 34.9 % — ABNORMAL LOW (ref 39.0–52.0)
MCHC: 34.1 g/dL (ref 30.0–36.0)
MCV: 94.1 fL (ref 78.0–100.0)
RDW: 14 % (ref 11.5–15.5)

## 2010-09-10 LAB — IRON AND TIBC
Iron: 89 ug/dL (ref 42–135)
UIBC: 193 ug/dL

## 2010-09-10 LAB — URINALYSIS, ROUTINE W REFLEX MICROSCOPIC
Glucose, UA: NEGATIVE mg/dL
Specific Gravity, Urine: 1.019 (ref 1.005–1.030)
pH: 5.5 (ref 5.0–8.0)

## 2010-09-10 LAB — TSH: TSH: 1.381 u[IU]/mL (ref 0.350–4.500)

## 2010-09-10 LAB — DIFFERENTIAL
Basophils Absolute: 0 10*3/uL (ref 0.0–0.1)
Eosinophils Absolute: 0.1 10*3/uL (ref 0.0–0.7)
Eosinophils Relative: 1 % (ref 0–5)
Lymphocytes Relative: 12 % (ref 12–46)
Lymphs Abs: 1 10*3/uL (ref 0.7–4.0)
Monocytes Absolute: 0.9 10*3/uL (ref 0.1–1.0)

## 2010-09-10 LAB — URINE MICROSCOPIC-ADD ON

## 2010-09-10 LAB — CARDIAC PANEL(CRET KIN+CKTOT+MB+TROPI): Troponin I: <0.3 ng/mL (ref <0.30)

## 2010-09-11 LAB — CBC
HCT: 34.1 % — ABNORMAL LOW (ref 39.0–52.0)
Hemoglobin: 11.4 g/dL — ABNORMAL LOW (ref 13.0–17.0)
MCV: 95.8 fL (ref 78.0–100.0)
RBC: 3.56 MIL/uL — ABNORMAL LOW (ref 4.22–5.81)
RDW: 14.2 % (ref 11.5–15.5)
WBC: 7.5 10*3/uL (ref 4.0–10.5)

## 2010-09-11 LAB — OSMOLALITY: Osmolality: 275 mOsm/kg (ref 275–300)

## 2010-09-11 LAB — COMPREHENSIVE METABOLIC PANEL
ALT: 8 U/L (ref 0–53)
AST: 13 U/L (ref 0–37)
Albumin: 2.8 g/dL — ABNORMAL LOW (ref 3.5–5.2)
Calcium: 8.9 mg/dL (ref 8.4–10.5)
GFR calc Af Amer: >60 mL/min (ref >60)
Sodium: 131 mEq/L — ABNORMAL LOW (ref 135–145)
Total Protein: 5.9 g/dL — ABNORMAL LOW (ref 6.0–8.3)

## 2010-09-11 LAB — DIFFERENTIAL
Eosinophils Relative: 2 % (ref 0–5)
Lymphocytes Relative: 12 % (ref 12–46)
Lymphs Abs: 0.9 10*3/uL (ref 0.7–4.0)

## 2010-09-11 LAB — CORTISOL: Cortisol, Plasma: 7.3 ug/dL

## 2010-09-11 LAB — CARDIAC PANEL(CRET KIN+CKTOT+MB+TROPI)
CK, MB: 1.7 ng/mL (ref 0.3–4.0)
CK, MB: 1.7 ng/mL (ref 0.3–4.0)
Total CK: 34 U/L (ref 7–232)

## 2010-09-11 LAB — APTT: aPTT: 36 seconds (ref 24–37)

## 2010-09-11 LAB — MAGNESIUM: Magnesium: 2 mg/dL (ref 1.5–2.5)

## 2010-09-11 LAB — PRO B NATRIURETIC PEPTIDE: Pro B Natriuretic peptide (BNP): 1005 pg/mL — ABNORMAL HIGH (ref 0–450)

## 2010-09-12 LAB — BASIC METABOLIC PANEL
BUN: 16 mg/dL (ref 6–23)
CO2: 24 mEq/L (ref 19–32)
Glucose, Bld: 108 mg/dL — ABNORMAL HIGH (ref 70–99)
Potassium: 3.9 mEq/L (ref 3.5–5.1)
Sodium: 131 mEq/L — ABNORMAL LOW (ref 135–145)

## 2010-09-13 LAB — URINE CULTURE: Culture  Setup Time: 201205040123

## 2010-09-16 NOTE — Discharge Summary (Addendum)
Douglas Tucker, Douglas Tucker NO.:  [id number]  MEDICAL RECORD NO.:  [id number]           PATIENT TYPE:  I  LOCATION:  1432                         FACILITY:  [facility]  PHYSICIAN:  Brendia Sacks, MD    DATE OF BIRTH:  03-Oct-1918  DATE OF ADMISSION:  09/10/2010 DATE OF DISCHARGE:  09/15/2010                              DISCHARGE SUMMARY   PRIMARY CARE PROVIDER:  Dr. Cheri Rous in [place].  CARDIOLOGIST:  Richard A. Alanda Amass, M.D. of [place].  DISCHARGE DIAGNOSES: 1. Left humeral neck fracture. 2. Urinary tract infection. 3. Dementia with mild acute delirium secondary to narcotics. 4. Status post fall. 5. History of chronic systolic heart failure. 6. Coronary artery disease. 7. Chronic anemia. 8. Hyperlipidemia. 9. Zollinger-Ellison syndrome. 10.Prostate cancer.  MEDICATIONS: 1. Aspirin 81 mg p.o. daily. 2. Calcium/vitamin D chewable one p.o. t.i.d. 3. Exelon 9.5 mg patch transdermally daily. 4. Ferrous sulfate 325 mg p.o. t.i.d. after meals. 5. Fish oil 1000 mg p.o. b.i.d. 6. Metoprolol tartrate 12.5 mg p.o. daily. 7. Midodrine 2.5 mg p.o. b.i.d. 8. Namenda 10 mg p.o. daily at bedtime. 9. Nexium 40 mg p.o. b.i.d. 10.Simvastatin 40 mg p.o. daily. 11.Sonata 10 mg p.o. daily at bedtime. 12.Tylenol 325 mg 2 tablets p.o. t.i.d. as needed for pain.  DIAGNOSTIC TESTS:  WBC is 8.5, hemoglobin 11.9, hematocrit 34.9, platelets 215.  Sodium 131, potassium 4.4, chloride 98, CO2 of 25, BUN 20, creatinine 1.01, glucose 94, albumin 3.3.  Urinalysis with moderate leukocytes, positive nitrites, cloudy in appearance, many bacteria, 11 to 20 WBCs.  Total CK 43, CK-MB 1.9, troponin I less than 0.30.  TSH 1.38.  Anemia panel yields iron of 89, TIBC 282, percent saturation 32. Vitamin B12 554, folate 12.8, ferritin 139, total CK of 37, CK-MB 1.7, troponin I less than 0.30.  Left shoulder x-ray yields a left humeral neck fracture.  CT of the head yields no  intracranial trauma.  Atrophy and microvascular disease.  CT of the cervical spine yields no evidence of cervical spine fracture.  Discussed the esthetic disease and facet hypertrophy.  CONSULTATIONS: 1. Dr. Simonne Come with Orthopedics. 2. [employer] pacemaker rep.  BRIEF SUMMARY:  Douglas Tucker is an 77 year old male with a history of coronary artery disease, permanent pacemaker for complete heart block, hypertension, dementia, who was brought by ambulance to the [facility] ED with a chief complaint of left shoulder and arm pain status post a fall.  The family reports that the patient had fallen the day before and has been compliant of left arm and shoulder pain ever since.  The patient had complained of dizziness prior to the fall but the fall was not witnessed.  The patient denies any loss of consciousness but reports that he does not remember the incident or exactly why he fell.  Workup in the emergency room yielded a urinary tract infection as well as the left humeral neck fracture.  The patient was admitted for further evaluation and treatment.  Of note, the patient had a right hip fracture in March of this year.  He went to [facility] once he was discharged  from the hospital.  He was subsequently discharged home with 24-hour home care and physical therapy.  HOSPITAL COURSE BY PROBLEMS: 1. Left humeral neck fracture.  Orthopedics consulted and recommended     a left upper extremity immobilizer.  Immobilizer is to be worn at     all times, only to be taken off for skin care.  There is to be no     range of motion for that shoulder. 2. Urinary tract infection.  Culture yields E coli.  The patient     completed 3 days of IV Rocephin. 3. Dementia with mild delirium secondary to narcotics.  The patient     experienced some delirium presumably secondary to narcotic pain     medicine.  Once narcotics were discontinued, the patient returned     to baseline mental  status.  At the time of discharge, the patient     is on Tylenol for pain and at his baseline mental status. 4. Status post fall.  The patient was evaluated by PT and OT.     Recommend SNF placement. 5. History of chronic systolic heart failure.  The patient had a 2-D     echo during this hospitalization which yielded an ejection fraction     of 50% to 55%.  The patient's beta blocker and spironolactone were     held at the time of admission.  His beta-blocker was eventually     restarted.  Spironolactone remains on hold secondary to risk of     hypotension.  This will need to be monitored and reevaluated by     primary care provider. 6. Coronary artery disease.  No chest pain.  The patient continued on     aspirin. 7. Chronic anemia, stable during this hospitalization.  Continue iron. 8. Hyperlipidemia.  Continue Zocor. 9. Zollinger-Ellison syndrome.  The patient's Nexium was continued. 10.History of prostate cancer status post seed implantation.  CODE STATUS:  Dr. Irene Limbo spoke to family at length about the patient's code status.  The patient remains a full code.  PHYSICAL EXAMINATION:  VITAL SIGNS:  Temperature 98.7, blood pressure 120/77, heart rate 66, respirations 18, sats 97% on room air. GENERAL:  Awake, alert, no acute distress. CV:  Regular rate and rhythm.  No murmur, gallop or rub.  No lower extremity edema. RESPIRATORY:  Normal effort.  Breath sounds clear to auscultation bilaterally.  No rhonchi, wheezes or rales. ABDOMEN:  Flat, soft, positive bowel sounds throughout, nontender to palpation. MUSCULOSKELETAL:  Moves all extremities except his left upper extremity which is in an immobilizer which is intact.  Fingers to that left hand are warm to touch.  Nailbed is pink with quick capillary refill.  LABORATORY DATA:  Sodium 131, potassium 3.9, chloride 99, CO2 of 24, BUN 16, creatinine 0.96, glucose 108.  DISCHARGE INSTRUCTIONS:  The patient being discharged to  facility for short-term rehab.  Left upper extremity immobilizer to remain intact, only to be removed for skin care.  There is to be no range of motion to that left shoulder.  DIET:  Heart healthy diet.  ACTIVITY:  Ambulates with assistance only.  Fall precautions.  Will need PT at facility.  FOLLOWUP:  The patient is to see Dr. Simonne Come in 2 to 3 weeks.  His office will arrange.  This discharge plan was discussed with Dr. Irene Limbo.  Time spent on discharge was 55 minutes.     Gwenyth Bender, NP   ______________________________ Brendia Sacks, MD  KMB/MEDQ  D:  09/14/2010  T:  09/14/2010  Job:  [id number]  cc:   Cheri Rous Fax: [phone]  Richard A. Alanda Amass, M.D. Fax: [phone]  Electronically Signed by Brendia Sacks  on 09/16/2010 09:41:58 PM Electronically Signed by Toya Smothers  on 09/30/2010 04:47:45 PM

## 2010-09-22 NOTE — Op Note (Signed)
Douglas Tucker, Douglas Tucker NO.:  [id number]   MEDICAL RECORD NO.:  [id number]          PATIENT TYPE:  INP   LOCATION:  3315                         FACILITY:  [facility]   PHYSICIAN:  Douglas Priestly, MD  DATE OF BIRTH:  03-17-1919   DATE OF PROCEDURE:  01/10/2007  DATE OF DISCHARGE:                               OPERATIVE REPORT   PROCEDURE:  Insertion of a Medtronic Adapta ADDR01 generator, serial  number [id number], with active atrial and ventricular leads.   ATTENDING PHYSICIAN:  Douglas Tucker, M.D.  Douglas Tucker, M.D.   COMPLICATIONS:  None.   INDICATIONS:  Douglas Tucker is an 77 year old male patient of Dr. Franchot Tucker with a history of known CAD status post remote bypass surgery  in 1999 consisting of LIMA to LAD, vein graft diagonal, and vein graft  to RCA.  He has a history of chronic bradycardia.  His last  echocardiogram in 2006 revealed normal LV function.  He was admitted on  January 08, 2007, with fatigue and found to be significantly bradycardic  with heart rates in the 40s 50s.  He is now brought for cardiac  catheterization which revealed totally occluded vein graft to the  diagonal.  He had a patent LIMA to LAD and patent vein graft to the  distal RCA with widely patent circumflex.  His EF was approximately 35%.  Given his history of dementia, we felt that proceeding with a permanent  pacer implant secondary to bradycardia to allow adequate treatment of  his CAD with no indication for ICD given his dementia.   DESCRIPTION OF PROCEDURE:  After obtaining informed written consent, the  patient was brought to the cardiac cath lab.  The left chest was shaved,  prepped and draped in a sterile fashion.  ECG monitoring was  established.  1% lidocaine was used to anesthetize the left mid  subclavicular area.  Next, an approximately 3 cm transverse incision was  carried out beneath the left clavicle and hemostasis was obtained with  electrocautery.   Blunt dissection was used to carry this down to the  left pectoral fascia.  Next, an approximately 3 x 4 cm pocket was then  created over the left pectoral fascia and hemostasis obtained with  electrocautery.  The left subclavian vein was then easily entered x2  with two retained guidewires.  A 4-0 silk suture was then placed at the  base of the four retained guidewires.  Over the first retained  guidewire, a 7 French dilator and sheath were then easily inserted and  the dilator and guidewire were removed.  Through this, a 58 cm Medtronic  active lead, model 5076, serial [id number], was then passed into the  right atrium and the peel away sheath was removed.  Over the second  retained guidewire, a second 7 [place] dilator and sheath were then  easily passed into the left subclavian vein and the dilator and  guidewire were removed.  Through this, a 52 cm active Medtronic lead,  model number 4076, serial number [id number], was then passed into the  right atrium, the  peel away sheath were then removed.  A J curve was  then placed in the ventricular stylet ventricular and the ventricular  lead allowed to prolapse in the tricuspid valve and positioned in the RV  apex.  We were able to R-waves of approximately 7-10 with capture of 2  volts and the screw was then extended and the thresholds were then  determined.  R-waves were measured at 6.9 volts, impedance 1389 ohms.  Threshold in the ventricle was 0.7 volts at 0.5 milliseconds.  Current  is 0.6 milliamps.  10 volts negative for diaphragmatic stimulation.  The  preformed atrial stylet was then placed in the atrial lead and the  atrial lead was then positioned in the area of the atrial appendage and,  again, we were able to capture 2 volts.  The screw was then extended and  thresholds determined.  P-waves were measured at 1.9 volts, impedance 69  ohms.  Threshold in the atrium was 1 volts at 0.5 milliseconds.  Currents 2 mA.  Again, 10 volts was  negative for diaphragmatic  stimulation.  The leads were sutured into place with 2-0 silk sutures  per lead to anchor these to the pectoral fascia.  The pocket was then  copiously irrigated with 1% Kanamycin solution.  The leads were  connected in a serial fashion to a Medtronic Adapta ADDR01 generator,  serial [id number].  Head screws were tightened and pacing was confirmed.  A single silk suture was placed in the superior aspect of the pocket.  The generator leads were then delivered in the pocket and the header was  secured with a silk suture.  The subcutaneous layer was then closed  using 2-0 Vicryl.  The skin was then closed using running 4-0 Vicryl.  Steri-Strips were then applied.  The patient returned to the recovery  room in stable condition.   CONCLUSIONS:  Successful implant of a Medtronic Adapta ADDRO1 generator,  serial number [id number], with active atrial and ventricular leads.      Douglas Priestly, MD  Electronically Signed     RHM/MEDQ  D:  01/10/2007  T:  01/10/2007  Job:  [id number]   cc:   Douglas Tucker, M.D.

## 2010-09-22 NOTE — H&P (Signed)
Douglas Tucker NO.:  [id number]   MEDICAL RECORD NO.:  [id number]          PATIENT TYPE:  EMS   LOCATION:  MAJO                         FACILITY:  [facility]   PHYSICIAN:  Richard A. Alanda Amass, M.D.DATE OF BIRTH:  [birth year]   DATE OF ADMISSION:  01/08/2007  DATE OF DISCHARGE:                              HISTORY & PHYSICAL   CHIEF COMPLAINT:  Weak and dizzy.   HISTORY OF PRESENT ILLNESS:  Douglas Tucker is an 77 year old male, known  to Dr. Alanda Amass with a history of coronary disease. He had a DMI,  treated with an angioplasty at [facility] in 1991. He  had bypass surgery in 1999. He has not required re-study. He had a  Myoview study in 2006 that showed mild lateral ischemia but the patient  has been asymptomatic. He comes into the office for yearly checkups. An  echocardiogram in 2006 showed good LV function with mild MR, mitral  valve prolapse, and mildly elevated RV pressures. He has chronic  bradycardia with PVC's. In the past, we felt these not to be  symptomatic. He saw Dr. Alanda Amass on January 06, 2007. He had had a dizzy  spell and went to [facility] ER and was seen there. He was put on  some Antivert. Dr. Alanda Amass stopped his hydrochlorothiazide and put an  event monitor on him. The patient presented to the emergency room  tonight, brought in by his family. He apparently had dizzy spell. The  details are not clear. The patient is somewhat confused. He cannot  remember why he came to the emergency room. He could not give me many  details of his medical history. He just said that he has a bad memory.   PAST MEDICAL HISTORY:  According to our records, show a history of  reflux. He had left total hip in August of 2006. He has remote history  of prostate cancer by our records.   CURRENT MEDICATIONS:  According to the emergency room list are Cozaar 50  mg daily, HCTZ 25 mg daily, Nexium, Zocor 20, and Clonazepam 0.5 b.i.d.   ALLERGIES:  NO KNOWN DRUG ALLERGIES.   SOCIAL HISTORY:  He is married and lives with his wife in [place].  According to the records, he has 3 children and 8 grandchildren. He is a  non-smoker.   FAMILY HISTORY:  Unremarkable.   REVIEW OF SYSTEMS:  Unremarkable except as noted above. He denies any  history of GI bleeding or melena. He denies any history of syncope to  me. He denies any fever or chills.   PHYSICAL EXAMINATION:  VITAL SIGNS:  In the emergency room blood  pressure is 140/82, pulse 50. Temperature 98.2.  GENERAL:  Well developed, well nourished, elderly male in no acute  distress.  HEENT:  Normocephalic. Extraocular muscles intact. Sclerae nonicteric.  Lids, conjunctivae are within normal limits.  NECK:  Without bruit or JVD.  CHEST:  Clear to auscultation and percussion.  CARDIAC:  Regular rate and rhythm. Without obvious murmur, rub, or  gallop. Normal S1 and S2.  ABDOMEN:  Nontender. No hepatosplenomegaly.  EXTREMITIES:  Without edema. Distal pulses are intact.  NEUROLOGIC:  Grossly intact. Awake, alert, and cooperative, although he  was unable to give me details of his medical history. He also gave me  the date as March 10, 2008.   LABORATORY DATA:  White count 6.3. Hemoglobin and hematocrit 12.5 and  38.6. Platelets 195,000. Sodium 139, potassium 4.0, BUN 40, creatinine  1.5. Troponin's are negative x2.   EKG shows sinus bradycardia with first degree AV block, a long PR  interval of 360. This is more than previous PR interval of 258 back in  August 2006.   IMPRESSION:  1. Dizzy spells, possibly secondary to bradycardia.  2. Known coronary disease with coronary artery bypass grafting in 1999      with abnormal Myoview but no symptoms.  3. History of preserved left ventricular function.  4. Mild confusion.  5. Gastroesophageal reflux disease.  6. Treated dyslipidemia.  7. Treated hypertension.  8. Remote history of prostate cancer via records.   PLAN:   The patient will be admitted to telemetry. Will go ahead and  cycle enzymes. Will go ahead and hydrate him gently. He may need a  pacemaker.      Douglas Tucker, P.A.      Richard A. Alanda Amass, M.D.  Electronically Signed    LKK/MEDQ  D:  01/08/2007  T:  01/09/2007  Job:  [id number]

## 2010-09-22 NOTE — Discharge Summary (Signed)
NAMEAYAD, NIEMAN NO.:  [id number]   MEDICAL RECORD NO.:  [id number]          PATIENT TYPE:  INP   LOCATION:  4732                         FACILITY:  [facility]   PHYSICIAN:  Richard A. Alanda Amass, M.D.DATE OF BIRTH:  01/12/1919   DATE OF ADMISSION:  01/08/2007  DATE OF DISCHARGE:  01/13/2007                               DISCHARGE SUMMARY   ADDENDUM TO DISCHARGE SUMMARY:   DISCHARGE HOME MEDICATIONS:  1. Nexium 40 mg a day.  2. Simvastatin 40 mg at bedtime.  3. Aspirin 81 mg a day.  4. Clonazepam 0.5 mg half twice per day.  5. Metoprolol 25 mg twice per day.  6. Nitroglycerin p.r.n.  7. Cozaar 25 mg once per day.      [name], N.P.      Richard A. Alanda Amass, M.D.  Electronically Signed    BB/MEDQ  D:  01/13/2007  T:  01/14/2007  Job:  [id number]

## 2010-09-22 NOTE — Cardiovascular Report (Signed)
NAMEBACH, ROCCHI NO.:  [id number]   MEDICAL RECORD NO.:  [id number]          PATIENT TYPE:  INP   LOCATION:  2807                         FACILITY:  [facility]   PHYSICIAN:  Ritta Slot, MD     DATE OF BIRTH:  04/09/1919   DATE OF PROCEDURE:  01/10/2007  DATE OF DISCHARGE:                            CARDIAC CATHETERIZATION   SUPERVISING ATTENDING:  Darlin Priestly, M.D.   PROCEDURE PERFORMED:  1. Selective coronary angiography of the native coronary circulation      by the Judkins setting.  2. Retrograde left heart catheterization.  3. Left ventricular angiogram for selective visualization of multiple      saphenous grafts.  4. Selective visualization at the internal mammary artery graft.   COMPLICATIONS:  None.   ENTRY SITE:  Right femoral artery.   DYE USED:  Omnipaque.   PATIENT PROFILE:  The patient is an 77 year old gentleman who was  admitted January 09, 2007, with dizzy spells.  He denies history of a  coronary artery bypass grafting with a recent myocardial perfusion scan  performed in 2006, demonstrating abnormal lateral ischemia, but no  symptoms.  A 2D echo in 2006, showed normal LV function.  He has chronic  sinus bradycardia with premature ventricular contractions, but no issue  of syncope.  He was admitted on January 09, 2007 with an episode of the  dizzy spell, presyncopal, but does not pass out.  He was found to have a  2:1 heart block and a 14 beat run of VT while an inpatient.  He was  brought to the cardiac catheterization laboratory for further  visualization of his coronary arteries and graft and anticipation of his  VT being an ischemic event in addition to his syncope being an ischemic  event.   RESULTS:  PRESSURES  The LV pressure was 131/83, central aortic pressure was 131/77.  No  aortic valve gradient by pull back technique was noted.  ANGIOGRAPHIC RESULTS  1. Left main coronary artery appears normal.  2. That  anterior descending artery had a 95% mid LAD occlusion, mid      LAD stenosis at the bifurcation of the diagonal.  There was TIMI 2      flow distally.  The LAD dimer had a 95% osteal stenosis at his      origin from the LAD, it was a small vessel.  Left circumflex artery      had diffuse proximal 40% to 50% lesion.  It was a large artery      giving rise to a large OM branch applying to the lateral wall.  The      left ventricular, I would say had a 100% proximal occlusion.  The      saphenous vein graft to the right coronary artery was patent with      good flow distal to the anastomosis site, saphenous vein graft to      the diagonal with 100% occluded at his origin of the aorta and was      not visualized any further.  The left internal mammary  artery to      the LAD was engaged electively.  There was a thin small vessel that      was applied to the distal LAD.  There was no evidence of any      lesions or stenoses of the anastomotic site and there was TIMI 3      flow just distally to its insertion site.   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  So there was no evidence of mitral regurgitation.   PLAN:  The patient per say did immediate to have a permanent pacemaker  inserted and he should be started on a beta blockade for his coronary  artery disease.      Ritta Slot, MD  Electronically Signed     HS/MEDQ  D:  01/10/2007  T:  01/10/2007  Job:  [id number]

## 2010-09-22 NOTE — Discharge Summary (Signed)
[name], [name] NO.:  [id number]   MEDICAL RECORD NO.:  [id number]          PATIENT TYPE:  INP   LOCATION:  4732                         FACILITY:  [facility]   PHYSICIAN:  Richard A. Alanda Amass, M.D.DATE OF BIRTH:  March 28, 1919   DATE OF ADMISSION:  01/08/2007  DATE OF DISCHARGE:  01/13/2007                               DISCHARGE SUMMARY   HOSPITAL COURSE:  [name] is an 77 year old with a history of  coronary artery bypass grafting in 1999 and Myoview in 2006, abnormal  but no symptoms.  He apparently came to the hospital because of a dizzy  spell.  He was seen at [facility] last week.  Denied any more  complaints about dizziness.  He has dementia, thus his memory is not  good for his symptomatology.  He was brought in to the hospital with  near syncope with some bradycardia, heart rate in the 50s.  During the  evening he did have some Wenckebach's and a 14-beat run of NSVT.  He was  still feeling dizzy that following morning.  He had torn out his IV.  He  was confused.  His heart rate was dropping down into the 40s.  It was  decided that he would need pacemaker implantation and possible  catheterization.  He had a previous CABG and an abnormal Cardiolite in  the year 2006.  Thus, he underwent cardiac catheterization.  It showed  an EF of 30-35%.  He had global hypokinesia.  He had an SVG to his  diagonal that was patent.  His other grafts were occluded.  He had high  grade in his native LAD, 95%.  He had diffuse disease.  He went on to  have pacemaker implanted by Dr. Lenise Herald.  He had a Medtronic  Adapta [id number], serial number [id number].  Postprocedure he was confused.  He was placed on metoprolol 25 mg b.i.d.  It was decided that his ARB  should be started 24 hours later.  His confusion became fairly severe.  His wife had to come in and stay with him in the middle of the night.  It was decided that he may need to undergo nursing home placement  for  rehab.  He had a PT and OT evaluation while in the hospital.  The case  manager found a bed offer at [facility]; however, the family then decided  that they would take him home.  Thus on January 13, 2007, he was ready  to be discharged after home health care was set up.  He will have PT and  OT therapy at home and an aide to help out.   LABORATORY DATA:  January 11, 2007 his hemoglobin was 14.6, hematocrit  42.5, WBC is 14, platelets 214.  His sodium was 136, potassium 3.6,  chloride 103, CO2 26.  His glucose was 130.  BUN was 18, creatinine  1.19.  Now his pressures were variable.  They did go as low on September  4 to 82/53 and the highest except the day of discharge was 121/75.  They  have a recorded blood  pressure of 150/96 on September 5; however, I am  leery that this pressure is correct since he has had such low blood  pressures all along.  CK-MBs and troponins were negative after his  catheterization and pacemaker.  His TSH was 2.239.  His lipid profile  showed a total cholesterol of 151, HDL 47, LDL of 90.  CK-MBs and  troponins were negative.  A chest x-ray shows no complications after  pacemaker placement.  He had no pneumothorax.   DISCHARGE DIAGNOSES:  1. Arrhythmia, sinus bradycardia and Wenckebach.  Patient is      symptomatic with dizziness symptoms and near syncope.  Thus,      pacemaker implanted.  2. Status post  pacemaker generator __________  secondary to #1 with      Medtronic Adapta [id number].  3. Coronary artery disease with a history of coronary artery bypass      grafting in 1999, now status post cardiac catheterization January 10, 2007 showing his saphenous vein graft to his diagonal 1 was 100%      occluded.  He had a patent left internal mammary artery to his left      anterior descending, a patent saphenous vein graft to his right      coronary artery.  He had severe three-vessel coronary disease and      an ejection fraction of 35%.  4. Ischemic  cardiomyopathy with an ejection fraction of 35% with a      previous history of myocardial infarction.  5. Nonsustained ventricular tachycardia at a 14-beat run while in the      hospital.  6. Hyperlipidemia.  7. Low blood pressure.  8. History of remote prostate cancer.  9. Dementia significant with increased confusion in the hospital.      [name], N.P.      Richard A. Alanda Amass, M.D.  Electronically Signed    BB/MEDQ  D:  01/13/2007  T:  01/14/2007  Job:  [id number]   cc:   Cheri Rous

## 2010-09-25 NOTE — Op Note (Signed)
NAMESEUNG, NIDIFFER NO.:  [id number]   MEDICAL RECORD NO.:  [id number]          PATIENT TYPE:  INP   LOCATION:  5009                         FACILITY:  [facility]   PHYSICIAN:  Vania Rea. Supple, M.D.  DATE OF BIRTH:  1918/09/04   DATE OF PROCEDURE:  12/27/2004  DATE OF DISCHARGE:                                 OPERATIVE REPORT   ADDENDUM:  The femoral head was sent for routine pathology in consideration  of his recent diagnosis and treatment for prostate CA.      Vania Rea. Supple, M.D.  Electronically Signed     KMS/MEDQ  D:  12/27/2004  T:  12/27/2004  Job:  [id number]

## 2010-09-25 NOTE — Discharge Summary (Signed)
NAMECHAI, ROUTH NO.:  [id number]   MEDICAL RECORD NO.:  [id number]          PATIENT TYPE:  INP   LOCATION:  5009                         FACILITY:  [facility]   PHYSICIAN:  Vania Rea. Supple, M.D.  DATE OF BIRTH:  Feb 21, 1919   DATE OF ADMISSION:  12/27/2004  DATE OF DISCHARGE:  01/04/2005                                 DISCHARGE SUMMARY   ADMISSION DIAGNOSES:  1.  Left femoral neck fracture.  2.  Zollinger-Ellison syndrome.  3.  Gastroesophageal reflux disease.  4.  Remote history of coronary artery disease with coronary artery bypass      graft in 1990.  5.  History of prostate cancer.   DISCHARGE DIAGNOSES:  1.  Left femoral neck fracture.  2.  Zollinger-Ellison syndrome.  3.  Gastroesophageal reflux disease.  4.  Remote history of coronary artery disease with coronary artery bypass      graft in 1990.  5.  History of prostate cancer.  6.  Status post left hip hemiarthroplasty.  7.  Postoperative delirium.  8.  Mild postoperative hypokalemia.   PROCEDURE:  Left hip hemiarthroplasty.   SURGEON:  Vania Rea. Supple, M.D.   Threasa HeadsFrench Ana A. Shuford, P.A.-C.   ANESTHESIA:  General.   CONSULTATIONS:  Rehabilitation consult.   HISTORY OF PRESENT ILLNESS:  Mr. Wigger is a very pleasant 77 year old male  who slid and fell onto his left hip from a seated position.  He had sudden  onset of left hip pain.  He was brought to [facility] emergency room where x-rays  demonstrated a left femoral neck fracture.  It was displaced.  After  discussion with the patient and family regarding the risks and benefits of  surgical fixation and left hip hemiarthroplasty, it was decided and wished  to proceed.   HOSPITAL COURSE:  The patient was admitted and underwent the above-noted  procedure and tolerated this well.  All appropriate perioperative IV  antibiotics and analgesics were administered.  Postoperatively, he was  placed on Coumadin for DVT and PE prophylaxis.  He  was on total hip  precautions, weightbearing as tolerated.  Postoperative films showed  excellent alignment of the hip prosthesis.  Neurovascularly, he remained  intact postoperatively.  Unfortunately, he did experience some postoperative  delirium without any particular organic or metabolic cause.  It was felt to  be likely from sundowning and age related.  He began to clear somewhat  throughout the hospitalization; however, he was having periods of confusion.  He was not felt to be an appropriate candidate for inpatient rehabilitation  stay; therefore, an outpatient facility was sought.  This dictation is being  done in anticipation for bed availability on Monday.   On today's date, January 01, 2005, he is afebrile, and his vital signs are  stable.  He is neurovascularly intact.  We will recheck a BMET and check on  his potassium, but otherwise he is doing well.  He is somewhat confused  today but alert to person but not place and time.  He is ambulating well  over 100 foot distances.  Will go ahead and do bed __________ through the  weekend and anticipate discharge to a skilled care facility short-term for  Monday morning.   LABORATORY DATA:  On admission, his hemoglobin was 12.9, postoperatively  down to 11.2 and stable.  Protime and INR __________ by pharmacy on Coumadin  for DVT and PE prophylaxis.  Chemistries did show mild postoperative  hypokalemia down to 3.3 and on December 30, 2004, 3.1.  Will recheck him  today.  Urinalysis was within normal limits.  Surgical pathology shows no  evidence of metastatic lesion.  Radiology showed a satisfactory appearance  of the left hip by hemiarthroplasty.  EKG showed normal sinus rhythm.   CONDITION ON DISCHARGE:  Stable.   DISPOSITION:  The patient is being discharged to a skilled care facility.  He is weightbearing as tolerated with total hip precautions with physical  therapy.   DISCHARGE MEDICATIONS:  1.  Coumadin per pharmacy for a  total of three more weeks.  2.  Trinsicon one b.i.d. P.C.  3.  Cozaar 50 mg daily.  4.  Pindolol 2.5 mg daily.  5.  Hydrochlorothiazide 25 mg daily.  6.  Colace 100 mg b.i.d.  7.  Nexium 40 mg daily.  8.  Tylenol p.r.n. pain.  9.  He had been on Xanax 0.5 mg t.i.d. p.r.n. at home.  This was changed to      0.25 mg t.i.d. p.r.n.   DIET:  Regular.   DISCHARGE INSTRUCTIONS:  Keep the dressing changed as needed but may shower  at this time.  Leave Steri-Strips on skin.   FOLLOW UP:  Arrange followup in our office in two weeks.  Call for a time  and arrange transportation for repeat x-rays.     Tracy A. Shuford, P.A.-C.      Vania Rea. Supple, M.D.  Electronically Signed   TAS/MEDQ  D:  01/01/2005  T:  01/01/2005  Job:  [id number]

## 2010-09-25 NOTE — Op Note (Signed)
NAME:  Douglas Tucker, MAERTENS NO.:  [id number]   MEDICAL RECORD NO.:  [id number]          PATIENT TYPE:  AMB   LOCATION:  DAY                          FACILITY[facility]  [facility]   PHYSICIAN:  Ronald L. Earlene Plater, M.D.  DATE OF BIRTH:  Feb 21, 1919   DATE OF PROCEDURE:  09/24/2004  DATE OF DISCHARGE:                                 OPERATIVE REPORT   DIAGNOSIS:  Adenocarcinoma of the prostate, status post x-ray therapy.   PROCEDURE:  1.  Cystourethroscopy.  2.  Placement of suprapubic tube.  3.  Cryosurgical ablation of the prostate.   SURGEON:  Gaynelle Arabian, M.D.   ANESTHESIA:  Spinal.   ESTIMATED BLOOD LOSS:  Negligible.   TUBES:  A 14 [employer] suprapubic tube.   COMPLICATIONS:  None.   INDICATIONS FOR PROCEDURE:  Mr. Ternes is a very nice 77 year old white  male, who has a history of adenocarcinoma of the prostate and underwent  radiation therapy in 1995.  His PSA has been relatively stable and  subsequently increased to 6.11.  He underwent biopsy of the prostate, July 28, 2004, which revealed a bilateral Gleason score 7, which was 3 + 4 and  40% of the biopsy from the right side of the prostate had 20% of the biopsy  from the left side of the prostate.  He has undergone a full metastatic  underwent including a Prostascint scan which revealed no evidence of spread,  and he underwent cystoscopy and transrectal ultrasound for documentation.  He also has been seen by Dr. Nanetta Batty and underwent a full cardiac  work-up, and it was felt that surgery was safe.  After understanding risks.  benefits, and alternatives, he elected to proceed.   PROCEDURE IN DETAIL:  The patient was placed in a supine position.  After  proper spinal anesthesia, was placed in the dorsolithotomy position and  prepped and draped with Betadine in a sterile fashion.  Cystourethroscopy  was performed, and the bladder was visualized.  There was mild trilobar  hypertrophy.   The bladder was smooth-walled, and clear efflux of clear urine  was noted bilaterally.  Under direct vision, a [address] [employer]  fader-tip suprapubic tube was passed and attached with the COPE system and  sutured in place with silk.  The cystoscope was visually removed.  A 16  French Foley catheter was inserted.  The prostate was localized utilizing  the B&K transrectal ultrasound probe.  Proper measurements were obtained.  Iced seed cryosurgical probe were placed as noted in the chart, four in row  1, four in row 2.  Row 3, 4, and 5 were individualized seed placements  posteriorly.  Confirmation both with axial and sagittal scan was performed,  and we were comfortable with the localization and Thermistors were placed in  Denonvilliers fascia and the external sphincter.  Cystourethroscopy was then  performed.  There were no cryoprobes or Thermistors in the urethra.  The  external sphincter was moved and noted to be in good position in the  external sphincter.  An Amplatz super-stiff guidewire was placed  into the  bladder and the urethra warming device.  After the cystoscope had been  removed, the urethral warming device was placed and the urethra visualized  in good position.  Cryosurgery was then performed again as documented, and  we were comfortable with complete freeze.  Both Thermistors remained well  above zero.  Again, this is documented in the record.  An active thaw cycle  was performed until the prostate was fully visualized, and a second freeze  cycle was performed and utilizing axial and sagittal scans, we were very  comfortable with the freeze as covering the entire prostate, avoiding the  Denonvilliers fascia posteriorly and external sphincter distally.  A second  active thaw was performed.  The cryoprobes were removed as was the  transrectal ultrasound probe.  The pressure was held on the perineum until  there was no more oozing, and he was dressed sterilely.  The  urethral  warming device was maintained for 20 minutes due to the salvage nature of  the case following the active thaw, and the patient was taken to the  recovery room stable.      RLD/MEDQ  D:  09/24/2004  T:  09/24/2004  Job:  [id number]

## 2010-09-25 NOTE — H&P (Signed)
Douglas Tucker, BUONOCORE NO.:  [id number]  MEDICAL RECORD NO.:  [id number]           PATIENT TYPE:  I  LOCATION:  1432                         FACILITY:  [facility]  PHYSICIAN:  Kathlen Mody, MD       DATE OF BIRTH:  October 24, 1918  DATE OF ADMISSION:  09/10/2010 DATE OF DISCHARGE:                             HISTORY & PHYSICAL   PRIMARY CARE PHYSICIAN: 1. Dr. Cheri Rous. 2. Dr. Arvil Chaco.  CHIEF COMPLAINT:  Status post fall last night.  HISTORY OF PRESENT ILLNESS:  This is an 77 year old gentleman with a history of coronary artery disease, permanent pacemaker for complete heart block, hypertension, dementia, Zollinger-Ellison syndrome, hyperlipidemia, prostate cancer, status post seed implant, was brought in by ambulance along with his daughter for fall last night and left shoulder and left arm pain.  The family present at bedside is the patient's daughter, also power of attorney.  History was obtained from both from the patient and the patient's daughter who is a primary caregiver.  As per the daughter, the patient fell yesterday last night. The patient was complained of some dizziness just before the fall.  The patient's fall was not witnessed.  The patient denies any loss of consciousness at that time, but also mentions that he does not remember what happened exactly and why he fell.  The patient denies any chest pain, trouble breathing, or palpitations.  The patient denies any fever, cough, or urinary complaints.  The patient is alert and awake, appears to be oriented to place and person.  The patient is complaining of pain in the left shoulder and left forearm.  The patient denies any nausea, vomiting, or abdominal pain.  The patient did mention some occasional loose bowel movements over the last few months.  Denies any headache, blurred vision, tingling, or numbness in the extremities or weakness. The patient had a right hip fracture in March for which he was  admitted to the hospital and was discharged to [facility] with Rehab and was subsequently discharged home with 24-hour home care and physical therapy.  REVIEW OF SYSTEMS:  See HPI, otherwise negative.  PAST MEDICAL HISTORY: 1. History of coronary artery disease. 2. Systolic congestive heart failure with ejection fraction of 45% in     2012. 3. Hyperlipidemia. 4. Hypertension. 5. Prostate cancer status post radioactive seed implant. 6. Zollinger-Ellison syndrome. 7. GERD. 8. Dementia.  PAST SURGICAL HISTORY:  Pacemaker placement, complete heart block, and CABG.  The patient had right hip hemiarthroplasty.  SOCIAL HISTORY:  The patient denies smoking, EtOH, or IV drug abuse. The patient was recently discharged from the [facility].  He lives at home with his wife.  He has 24-hour home care with physical therapy.  He usually ambulates with the help of a walker.  ALLERGIES:  No known drug allergies.  HOME MEDICATIONS: 1. Iron supplements. 2. Aspirin. 3. Metoprolol 12.5 mg twice daily. 4. Nexium 40 mg twice daily. 5. Zocor 40 mg daily. 6. Aldactone 25 mg half a tablet daily.  PHYSICAL EXAMINATION:  VITAL SIGNS:  Temperature of 98.1, pulse of 78 to  80, respiratory rate 16, blood pressure 148/80, and saturating 97% on room air. GENERAL:  On exam, he is alert, afebrile, comfortable, oriented to place and person. HEENT:  Pupils equal and reacting to light.  Moist mucous membranes.  No scleral icterus. NECK:  No JVD. CARDIOVASCULAR:  Regular rate and rhythm.  S1 and S2 heard. RESPIRATORY:  Good air entry bilaterally. ABDOMEN:  Soft, nontender, and nondistended.  Bowel sounds present. EXTREMITIES:  No pedal edema. NEUROLOGICAL:  The patient is oriented to person and place. EXTREMITIES:  He is able to move extremities, but has a sling over the left shoulder.  We did not ambulate the patient at this time.  EKG is paced at 78 per minute.  PERTINENT  LABORATORY DATA: 1. CBC showed a hemoglobin of 11.9 and hematocrit of 34.9.  His     baseline hemoglobin runs between 10 to 11. 2. Comprehensive metabolic panel, significant for a sodium of 131 and     albumin of 3.3.  Urinalysis showed positive nitrites and moderate     leukocytes with many bacteria.  RADIOLOGY: 1. The patient had a left shoulder x-ray, which showed left humeral     neck fracture. 2. X-ray of the humerus shows left humeral neck fracture. 3. CT head without contrast shows no evidence of intracranial trauma,     atrophy, and microvascular disease present.  CT of the cervical     spine shows no evidence of cervical spine fracture.  There is     osteophytic disease and facet hypertrophy.  ASSESSMENT AND PLAN: 1. Status post fall, question possibly presyncopal episode, admit the     patient to Telemetry.  We will get a set of orthostatics in the     morning.  We will monitor the patient for 20 to 24 hours on     Telemetry.  Put the patient on fall precautions.  The last     echocardiogram from the records is from 2008 with an ejection     fraction of 35%, so I would repeat a 2-D echocardiogram at this     time, get cardiac enzymes x3 q.6 h.  We will continue the patient     on aspirin 81 mg at this time. 2. Left humeral fracture.  The patient's has a left humeral neck     fracture with minimal displacement.  The patient is on a sling of     the left arm and forearm.  We will consult Orthopedics as needed.     Continue with pain control at this time. 3. Abnormal urinalysis with positive nitrates and moderate leukocytes     with many bacteria.  The patient had a recent urinary tract     infection, treated with 5 days of ciprofloxacin.  I will get a     urine culture at this time and start the patient on Rocephin after     the urine culture is sent. 4. Coronary artery disease.  Continue the patient on aspirin.  Denies     any chest pain at this time. 5. Systolic  congestive heart failure appears to be compensated.  We     will hold metoprolol and Aldactone today till we get the     orthostatics in the morning and the patient can be restarted with     his home medications in the morning. 6. Zollinger-Ellison syndrome.  The patient's Nexium was recently     changed to b.i.d. dose.  We will continue with  Nexium 40 mg b.i.d. 7. Hyperlipidemia.  Continue with Zocor 40 mg daily. 8. Normocytic anemia.  The patient's hemoglobin is 11.9.  Baseline     usually runs between 10 to 11.  We will get an anemia profile and     go from there. 9. Hyponatremia.  The patient's sodium is 131.  There were episodes of     hyponatremia on last admission and we will definitely rule out     syndrome of inappropriate secretion of antidiuretic hormone.  It     could probably be secondary to dehydration.  Also, the patient was     already given IV fluids, normal saline about 1 L while he was in     the ER.  We will repeat electrolytes in the morning. 10.Prostate cancer.  The patient had radioactive seed implant.  Denies     any urinary complaints at this time. 11.For deep venous thrombosis prophylaxis, subcutaneous Lovenox. 12.The patient is full code.          ______________________________ Kathlen Mody, MD   VA/MEDQ  D:  09/10/2010  T:  09/10/2010  Job:  [id number]  Electronically Signed by Kathlen Mody MD on 09/25/2010 09:01:46 PM

## 2010-09-25 NOTE — Op Note (Signed)
[name], [name] NO.:  [id number]   MEDICAL RECORD NO.:  [id number]          PATIENT TYPE:  INP   LOCATION:  5009                         FACILITY:  [facility]   PHYSICIAN:  Vania Rea. Supple, M.D.  DATE OF BIRTH:  08-Mar-1919   DATE OF PROCEDURE:  12/27/2004  DATE OF DISCHARGE:                                 OPERATIVE REPORT   PREOPERATIVE DIAGNOSIS:  Displaced left femoral neck fracture.   POSTOPERATIVE DIAGNOSIS:  Displaced left femoral neck fracture.   PROCEDURE:  Cemented left hip unipolar hemiarthroplasty utilizing an  Osteonics HFX size 8 femoral stem, a 52-mm head, and a plus-5 neck length.   SURGEON:  Vania Rea. Supple, MD.   Threasa HeadsLucita Lora. Shuford, PA-C.   ANESTHESIA:  General endotracheal.   ESTIMATED BLOOD LOSS:  200 mL.   DRAINS:  None.   HISTORY:  [name] is an 77 year old gentleman, who fell at home early  this morning with immediate complaints of left hip pain and the inability to  bear weight.  He was brought to the emergency room at [facility] where on  examination he was found to have a foreshortened and externally rotated left  lower extremity.  Plain radiographs confirm a displaced left femoral neck  fracture.  He is brought to the operating room at this time for a planned  left hip hemiarthroplasty as described below.   Preoperatively, I had counseled [name] on treatment options as well as  the risks versus benefits thereof.  The possible surgical complications of  bleeding, infection, neurovascular injury, DVT, PE, the potential for  dislocation, and the possible need for additional surgery were reviewed.  [name] understand and accept and agree with the planned  procedure.   PROCEDURE IN DETAIL:  After undergoing routine preop evaluation, the patient  was brought to the operating room and placed supine on the operating table  and underwent a smooth induction of general endotracheal anesthesia.  He  was  turned to the right lateral decubitus position and appropriately padded and  protected.  The left hip girdle region was sterilely prepped and draped in  the standard fashion.  Prophylactic antibiotics were provided  perioperatively.  A standard posterior approach was made to the left hip  through a 15-cm incision centered over the greater trochanter.  The skin  flaps were elevated anteriorly and posteriorly, and electrocautery used for  hemostasis.  The deep fascia was then divided along the course of the skin  incision, with the gluteus maximus fibers split proximally in the wound.  A  Charnley retractor was placed.  The gluteus medius and minimus were then  retracted anteriorly, and the short external rotators were divided away from  the greater trochanter with electrocautery and then tagged with #1 Ethibond  sutures.  The capsule was then identified and divided, and the capsule was  then tagged with #1 Ethibond sutures.  The fracture site was then exposed.  The femoral head was removed and measured and a 52- mm diameter was noted.  The ligamentum teres and pulvinar were  then resected with electrocautery,  the acetabulum was irrigated, and a trial reduction was performed with the  52-mm head, which showed excellent reduction.  The proximal femur was then  exposed with a Mueller retractor and a guide pin directed down into the  femoral canal beginning at the piriformis fossa.  This was then overreamed,  the canal finder was passed, and sequential reamers were performed up to a  size 9.  We did use a trochanteric reamer.  Broaching was then performed up  to a size 8, which showed excellent fill of the proximal femur.  We did cut  the femoral neck at 1 fingerbreadth above the lesser trochanter using the  neck-cutting guide, and then performed the broachings.  A calcar planer was  then used to smooth the calcar.  A distal cement plug was placed.  The canal  was pulsatively lavaged and  cleaned.  The cement was then mixed at the back  table and was introduced into the canal in the retrograde fashion and  pressurized, and then the stem was impacted into position.  All extra cement  was meticulously removed.  A trial reduction was performed and a plus-5 neck  length showed excellent soft tissue balance, excellent mobility, and good  stability.  The leg lengths were clinically equal.  The trial was then  removed.  The final implants were tapped into position after the trunnion  was meticulously cleaned.  A final reduction was performed, again confirmed  excellent mobility and stability of the hip with the clinically equal leg  lengths.  The wound was terminally irrigated.  Hemostasis was obtained.  The  capsule was closed with a running #1 Ethibond, #1 Ethibond was used to  repair the short external rotators through the greater trochanter, then  interrupted figure-of-eight #1 Vicryl used to close the deep fascia, 2-0  Vicryl for the subcu, and an intracuticular 3-0 Monocryl for the skin  followed by Steri-Strips.  A dry dressing was then applied to the left hip.  The left leg was placed in a knee immobilizer.  The patient was rolled  supine, extubated, and taken to the recovery room in stable condition.      Vania Rea. Supple, M.D.  Electronically Signed     KMS/MEDQ  D:  12/27/2004  T:  12/27/2004  Job:  [id number]

## 2010-11-25 ENCOUNTER — Encounter: Payer: Self-pay | Admitting: Gastroenterology

## 2010-12-11 ENCOUNTER — Encounter: Payer: Self-pay | Admitting: Gastroenterology

## 2010-12-11 ENCOUNTER — Ambulatory Visit (INDEPENDENT_AMBULATORY_CARE_PROVIDER_SITE_OTHER): Payer: Medicare Other | Admitting: Gastroenterology

## 2010-12-11 VITALS — BP 106/70 | HR 76

## 2010-12-11 DIAGNOSIS — R110 Nausea: Secondary | ICD-10-CM

## 2010-12-11 MED ORDER — ONDANSETRON 4 MG PO TBDP: 4.0000 mg | ORAL_TABLET | Freq: Two times a day (BID) | ORAL | Status: AC

## 2010-12-11 NOTE — Patient Instructions (Addendum)
You should change the way you are taking your antiacid medicine (prilosec) so that you are taking it 20-30 minutes prior to a decent meal as that is the way the pill is designed to work most effectively. Tramadol #2 most common side effect is nausea. Start zofran (one pill twice a day) for as long as he requires tramadol daily. Call Dr. Christella Hartigan in 4-5 weeks. A copy of this information will be made available to Dr. Egbert Garibaldi.

## 2010-12-11 NOTE — Progress Notes (Signed)
HPI: This is a  very pleasant, mildly demented 77 year old man who is here with 2 of his daughters today.  He broke his hip, went to rehab.  Broke left arm twice, most recently one month ago.  Lives in rehab.  He was told her had Zolleger Everardo All many, many years ago by Jarold Motto, [facility].  Was on prilosec twice a day, then changed to nexium, then back to prilosec.  He is eating better with prilosec.  THis has been a problem for at least 2-3 months.  He has been on exelon patch for 5-6 years, tried to reduce it to 1/2 pill without any noticeable improvement in nausea.   He has been on tramadol 3 times a day since the hip injury, broken arm.  He takes prilosec AM and PM.      Review of systems: Pertinent positive and negative review of systems were noted in the above HPI section.  All other review of systems was otherwise negative.   Past Medical History  Diagnosis Date  . Other and unspecified hyperlipidemia   . Senile dementia, uncomplicated   . Congestive heart failure, unspecified   . Closed fracture of surgical neck of humerus   . Coronary atherosclerosis of native coronary artery   . Malignant neoplasm of prostate   . Dementia, unspecified, without behavioral disturbance   . Anemia, unspecified   . Other and unspecified hyperlipidemia   . Abnormality of secretion of gastrin     History reviewed. No pertinent past surgical history.   reports that he has never smoked. He has never used smokeless tobacco. He reports that he does not drink alcohol or use illicit drugs.  family history is not on file.    Current Medications, Allergies were all reviewed with the patient via [facility] electronic medical record system.    Physical Exam: BP 106/70  Pulse 76 Constitutional: generally well-appearing Psychiatric: alert and oriented x3 Eyes: extraocular movements intact Mouth: oral pharynx moist, no lesions Neck: supple no lymphadenopathy Cardiovascular: heart regular rate  and rhythm Lungs: clear to auscultation bilaterally Abdomen: soft, nontender, nondistended, no obvious ascites, no peritoneal signs, normal bowel sounds Extremities: no lower extremity edema bilaterally Skin: no lesions on visible extremities    Assessment and plan: 77 y.o. male with nausea for 2-3 months since hip fracture, arm fracture I suspect his orthopedic injury, tramadol use since the injury is likely causing or contributing to all of his nausea. He is 62 quite frail I think the simplest thing to do right now is to start him on empiric antinausea medicine, Zofran twice daily for as long as he needs to be on the tramadol. His family will call back in 4-5 weeks to report on his symptoms.

## 2011-02-19 LAB — COMPREHENSIVE METABOLIC PANEL
ALT: 16
AST: 23
Albumin: 3.8
Alkaline Phosphatase: 70
Chloride: 104
GFR calc Af Amer: >60
Potassium: 3.8
Sodium: 136
Total Bilirubin: 0.6

## 2011-02-19 LAB — POCT I-STAT CREATININE: Creatinine, Ser: 1.5

## 2011-02-19 LAB — CK TOTAL AND CKMB (NOT AT ARMC)
CK, MB: 2.5
CK, MB: 2.8
Relative Index: INVALID

## 2011-02-19 LAB — BASIC METABOLIC PANEL
BUN: 17
BUN: 18
Calcium: 8.9
Chloride: 103
GFR calc Af Amer: >60
GFR calc non Af Amer: 58 — ABNORMAL LOW
GFR calc non Af Amer: 58 — ABNORMAL LOW
GFR calc non Af Amer: >60
Glucose, Bld: 105 — ABNORMAL HIGH
Glucose, Bld: 130 — ABNORMAL HIGH
Potassium: 3.6
Potassium: 3.6
Potassium: 4
Sodium: 136
Sodium: 137

## 2011-02-19 LAB — I-STAT 8, (EC8 V) (CONVERTED LAB)
BUN: 23
Chloride: 105
Glucose, Bld: 98
Hemoglobin: 13.6
Potassium: 4
Sodium: 139

## 2011-02-19 LAB — APTT: aPTT: 32

## 2011-02-19 LAB — TROPONIN I
Troponin I: 0.01
Troponin I: 0.02

## 2011-02-19 LAB — DIFFERENTIAL
Lymphocytes Relative: 23
Lymphs Abs: 1.4
Monocytes Relative: 11
Neutro Abs: 4
Neutrophils Relative %: 64

## 2011-02-19 LAB — CBC
HCT: 37.7 — ABNORMAL LOW
HCT: 42.5
Hemoglobin: 13
Hemoglobin: 14.6
MCV: 94.8
MCV: 95.5
Platelets: 214
RBC: 3.83 — ABNORMAL LOW
RDW: 13.4
WBC: 14 — ABNORMAL HIGH
WBC: 4.8

## 2011-02-19 LAB — LIPID PANEL
Cholesterol: 151
LDL Cholesterol: 90
Total CHOL/HDL Ratio: 3.2
Triglycerides: 70

## 2011-02-19 LAB — POCT CARDIAC MARKERS
CKMB, poc: <1 — ABNORMAL LOW
Operator id: 277751
Troponin i, poc: <0.05

## 2011-09-30 ENCOUNTER — Other Ambulatory Visit (HOSPITAL_COMMUNITY): Payer: Self-pay | Admitting: Urology

## 2011-09-30 DIAGNOSIS — C61 Malignant neoplasm of prostate: Secondary | ICD-10-CM

## 2011-10-12 ENCOUNTER — Encounter (HOSPITAL_COMMUNITY)
Admission: RE | Admit: 2011-10-12 | Discharge: 2011-10-12 | Disposition: A | Discharge status: Home / Self Care | Payer: Medicare Other | Source: Ambulatory Visit | Attending: Urology | Admitting: Urology

## 2011-10-12 ENCOUNTER — Encounter (HOSPITAL_COMMUNITY): Payer: Self-pay

## 2011-10-12 ENCOUNTER — Ambulatory Visit (HOSPITAL_COMMUNITY)
Admission: RE | Admit: 2011-10-12 | Discharge: 2011-10-12 | Disposition: A | Discharge status: Home / Self Care | Payer: Medicare Other | Source: Ambulatory Visit | Attending: Urology | Admitting: Urology

## 2011-10-12 DIAGNOSIS — C61 Malignant neoplasm of prostate: Secondary | ICD-10-CM | POA: Insufficient documentation

## 2011-10-12 MED ORDER — TECHNETIUM TC 99M MEDRONATE IV KIT
23.7000 | PACK | Freq: Once | INTRAVENOUS | Status: AC | PRN
  Administered 2011-10-12: 23.7 via INTRAVENOUS

## 2011-11-10 ENCOUNTER — Encounter (HOSPITAL_COMMUNITY): Payer: Self-pay | Admitting: *Deleted

## 2011-11-10 ENCOUNTER — Emergency Department (HOSPITAL_COMMUNITY)
Admission: EM | Admit: 2011-11-10 | Discharge: 2011-11-10 | Disposition: A | Discharge status: Home / Self Care | Payer: Medicare Other | Attending: Emergency Medicine | Admitting: Emergency Medicine

## 2011-11-10 ENCOUNTER — Emergency Department (HOSPITAL_COMMUNITY): Payer: Medicare Other

## 2011-11-10 DIAGNOSIS — Z8546 Personal history of malignant neoplasm of prostate: Secondary | ICD-10-CM | POA: Insufficient documentation

## 2011-11-10 DIAGNOSIS — R5381 Other malaise: Secondary | ICD-10-CM | POA: Insufficient documentation

## 2011-11-10 DIAGNOSIS — Z79899 Other long term (current) drug therapy: Secondary | ICD-10-CM | POA: Insufficient documentation

## 2011-11-10 DIAGNOSIS — R112 Nausea with vomiting, unspecified: Secondary | ICD-10-CM

## 2011-11-10 DIAGNOSIS — C61 Malignant neoplasm of prostate: Secondary | ICD-10-CM

## 2011-11-10 DIAGNOSIS — E785 Hyperlipidemia, unspecified: Secondary | ICD-10-CM | POA: Insufficient documentation

## 2011-11-10 DIAGNOSIS — I509 Heart failure, unspecified: Secondary | ICD-10-CM | POA: Insufficient documentation

## 2011-11-10 DIAGNOSIS — Z7982 Long term (current) use of aspirin: Secondary | ICD-10-CM | POA: Insufficient documentation

## 2011-11-10 DIAGNOSIS — R1110 Vomiting, unspecified: Secondary | ICD-10-CM | POA: Insufficient documentation

## 2011-11-10 DIAGNOSIS — I2510 Atherosclerotic heart disease of native coronary artery without angina pectoris: Secondary | ICD-10-CM | POA: Insufficient documentation

## 2011-11-10 DIAGNOSIS — R531 Weakness: Secondary | ICD-10-CM

## 2011-11-10 DIAGNOSIS — F0390 Unspecified dementia without behavioral disturbance: Secondary | ICD-10-CM | POA: Insufficient documentation

## 2011-11-10 LAB — COMPREHENSIVE METABOLIC PANEL
ALT: 10 U/L (ref 0–53)
Albumin: 3 g/dL — ABNORMAL LOW (ref 3.5–5.2)
Alkaline Phosphatase: 61 U/L (ref 39–117)
BUN: 19 mg/dL (ref 6–23)
Chloride: 99 mEq/L (ref 96–112)
Glucose, Bld: 104 mg/dL — ABNORMAL HIGH (ref 70–99)
Potassium: 3.7 mEq/L (ref 3.5–5.1)
Sodium: 133 mEq/L — ABNORMAL LOW (ref 135–145)
Total Bilirubin: 0.7 mg/dL (ref 0.3–1.2)

## 2011-11-10 LAB — URINALYSIS, ROUTINE W REFLEX MICROSCOPIC
Bilirubin Urine: NEGATIVE
Glucose, UA: NEGATIVE mg/dL
Hgb urine dipstick: NEGATIVE
Ketones, ur: NEGATIVE mg/dL
Leukocytes, UA: NEGATIVE
Protein, ur: NEGATIVE mg/dL
pH: 7 (ref 5.0–8.0)

## 2011-11-10 LAB — CBC
HCT: 38.5 % — ABNORMAL LOW (ref 39.0–52.0)
Hemoglobin: 13.1 g/dL (ref 13.0–17.0)
RDW: 13.9 % (ref 11.5–15.5)
WBC: 11.9 10*3/uL — ABNORMAL HIGH (ref 4.0–10.5)

## 2011-11-10 MED ORDER — SODIUM CHLORIDE 0.9 % IV BOLUS (SEPSIS)
500.0000 mL | Freq: Once | INTRAVENOUS | Status: AC
  Administered 2011-11-10: 500 mL via INTRAVENOUS

## 2011-11-10 MED ORDER — SODIUM CHLORIDE 0.9 % IV BOLUS (SEPSIS): 500.0000 mL | Freq: Once | INTRAVENOUS | Status: DC

## 2011-11-10 NOTE — Progress Notes (Signed)
[facility] ED CM consulted to assist with home health services. CM spoke with family and pt about Cm consult for home health Preference is to remain in the home as long as possible. Cm offered list of home health agencies for [place] to assist with making choice Pt family chose [facility] They reported success with this home health agency previously Below is list of agencies offered.  Offered private duty nursing but states pt already has private duty services. Offerd list of snfs and assisted living facilities but refused offer.  Ordered provided by Dr Hyacinth Meeker and referral completed via Care finder pro to [facility] of [facility] MEDICARE-CERTIFED HOME HEALTH AGENCIES [facility]   Agencies that are Medicare-Certified and affiliated with [facility]  Home Health Agency  Telephone Number Address  [facility]    The [facility] has ownership interest  in this company; however, you are under no obligation to use this agency. [phone]  [address] [place], [place] [address]   Agencies that are Medicare-Certified and are not affiliated with [facility] Agency  Telephone Number Address  [facility] [phone] Fax [phone] [address] [place], [place]  [address]  [facility] [phone] [place], [place]  [address]  [facility] [phone] Fax [phone] [address] [place], [place]  [address]  [facility] [phone] Fax [phone] [address] [place], [place] [address]  [facility] [phone] or  [phone] [address] [place], [place] [address]  [facility] [phone] Fax [phone] [address] [place], [place] [address]  [facility] [phone]  [address] [place], [place] [address]  [facility] [phone] or  [phone] [address] [place], [place] [address]   [facility]  [phone] [address] [place], [place]  [address]  [facility] [phone] Fax [phone] [address] [place], [place]  [address]

## 2011-11-10 NOTE — ED Notes (Signed)
Patient reevaluated at family's request due 2 ongoing generalized weakness, family states that right leg weaker than left leg however on my objective exam there is no weakness whatsoever in the upper or lower extremities, has normal strength, normal sensation and appears to be at baseline. Has tolerated by mouth, labs reviewed with family members including mild leukocytosis, clear urinalysis without signs of dehydration or infection and mild hyponatremia which appears chronic over time based on medical record reports.  I have attempted to walk with the patient, he is able to move his feet and hold onto 2 people helping him walk but does appear to have generalized weakness and difficulty walking. I feel that he does need at least 24-hour care which he does have, I have offered the patient and his family  consultation with the hospitalist for admission to the hospital or home health with rehabilitation and social work consultation on Friday. They have agreed to the latter and do not want her family member admitted to the hospital at this time.   Care manager consulted  Vida Roller, MD 11/10/11 1807

## 2011-11-10 NOTE — Progress Notes (Signed)
ED CM received an email response from [place] --AVAIL: YUVIN BUSSIERE MRN [id number];[facility];, "Thank you."; [phone]; [phone] ED CM requested ED RN  update pt/family

## 2011-11-10 NOTE — ED Notes (Signed)
Per EMS pt in from home c/o generalized weakness x's 3 days. Pt has hx of dementia. A&O per his norm.

## 2011-11-10 NOTE — ED Notes (Signed)
PT.was given a urinal

## 2011-11-10 NOTE — ED Provider Notes (Signed)
History     CSN: [id number]  Arrival date & time 11/10/11  1217   First MD Initiated Contact with Patient 11/10/11 1232      Chief Complaint  Patient presents with  . Weakness    (Consider location/radiation/quality/duration/timing/severity/associated sxs/prior treatment) Patient is a 78 y.o. male presenting with weakness. The history is provided by the patient and a relative.  Weakness The primary symptoms include vomiting. Primary symptoms do not include headaches or fever.  Additional symptoms include weakness. Additional symptoms do not include dysphoric mood.  pt in ed w few episodes nv this am and feeling generally weak. Emesis clear, no bloody or bilious emesis. No abd distension or pain. Having normal bms.  Denies fever or chills. No known bad food ingestion or ill contacts. No focal numbness/weakness. No headache. Denies cough or uri c/o. No chest pain or sob. No gu c/o. No fever or chills. Denies recent change in meds.   Past Medical History  Diagnosis Date  . Other and unspecified hyperlipidemia   . Senile dementia, uncomplicated   . Congestive heart failure, unspecified   . Closed fracture of surgical neck of humerus   . Coronary atherosclerosis of native coronary artery   . Malignant neoplasm of prostate   . Dementia, unspecified, without behavioral disturbance   . Anemia, unspecified   . Other and unspecified hyperlipidemia   . Abnormality of secretion of gastrin     History reviewed. No pertinent past surgical history.  History reviewed. No pertinent family history.  History  Substance Use Topics  . Smoking status: Never Smoker   . Smokeless tobacco: Never Used  . Alcohol Use: No      Review of Systems  Constitutional: Negative for fever and chills.  HENT: Negative for sore throat and neck pain.   Eyes: Negative for visual disturbance.  Respiratory: Negative for cough and shortness of breath.   Cardiovascular: Negative for chest pain and leg  swelling.  Gastrointestinal: Positive for vomiting. Negative for abdominal pain, diarrhea and blood in stool.  Genitourinary: Negative for dysuria and flank pain.  Musculoskeletal: Negative for back pain.  Skin: Negative for rash.  Neurological: Positive for weakness. Negative for numbness and headaches.  Hematological: Does not bruise/bleed easily.  Psychiatric/Behavioral: Negative for dysphoric mood.    Allergies  Review of patient's allergies indicates no known allergies.  Home Medications   Current Outpatient Rx  Name Route Sig Dispense Refill  . ACETAMINOPHEN 325 MG PO TABS Oral Take 650 mg by mouth every 6 (six) hours as needed.      . ASPIRIN 81 MG PO TABS Oral Take 81 mg by mouth daily.      Marland Kitchen CALCIUM CARBONATE-VITAMIN D 600-400 MG-UNIT PO TABS Oral Take 1 tablet by mouth 3 (three) times daily with meals.      Marland Kitchen ESOMEPRAZOLE MAGNESIUM 40 MG PO CPDR Oral Take 40 mg by mouth 2 (two) times daily.      Marland Kitchen LORATADINE 10 MG PO TABS Oral Take 10 mg by mouth daily.      Marland Kitchen MEMANTINE HCL 10 MG PO TABS Oral Take 10 mg by mouth daily.      Marland Kitchen METHOCARBAMOL 500 MG PO TABS Oral Take 500 mg by mouth 4 (four) times daily.      Marland Kitchen METOPROLOL TARTRATE 25 MG PO TABS Oral Take 12.5 mg by mouth daily.      Marland Kitchen MIDODRINE HCL 5 MG PO TABS Oral Take 5 mg by mouth 2 (two) times daily.      Marland Kitchen  ELDERTONIC PO Oral Take by mouth. 15 ml by mouth three times daily     . SUPER OMEGA 3 EPA/DHA 1000 MG PO CAPS Oral Take by mouth. 2 daily      . RIVASTIGMINE 9.5 MG/24HR TD PT24 Transdermal Place 1 patch onto the skin daily.      Marland Kitchen SIMVASTATIN 40 MG PO TABS Oral Take 40 mg by mouth at bedtime.      . TRAMADOL HCL 50 MG PO TABS Oral Take 50 mg by mouth every 6 (six) hours as needed.      Marland Kitchen ZALEPLON 10 MG PO CAPS Oral Take 10 mg by mouth at bedtime.        BP 144/82  Pulse 76  Temp 98.1 F (36.7 C) (Oral)  Resp 20  Wt 170 lb (77.111 kg)  SpO2 97%  Physical Exam  Nursing note and vitals  reviewed. Constitutional: He appears well-developed and well-nourished. No distress.  HENT:  Head: Atraumatic.  Nose: Nose normal.  Mouth/Throat: Oropharynx is clear and moist.  Eyes: Conjunctivae are normal. Pupils are equal, round, and reactive to light. No scleral icterus.  Neck: Normal range of motion. Neck supple. No tracheal deviation present. No thyromegaly present.  Cardiovascular: Normal rate, regular rhythm, normal heart sounds and intact distal pulses.   Pulmonary/Chest: Effort normal and breath sounds normal. No accessory muscle usage. No respiratory distress.  Abdominal: Soft. Bowel sounds are normal. He exhibits no distension and no mass. There is no tenderness. There is no rebound and no guarding.  Genitourinary:       No cva tenderness  Musculoskeletal: Normal range of motion. He exhibits no edema and no tenderness.  Neurological: He is alert. No cranial nerve deficit.       Alert, oriented, mental status at baseline per family. Motor intact bil.   Skin: Skin is warm and dry. He is not diaphoretic.  Psychiatric: He has a normal mood and affect.    ED Course  Procedures (including critical care time)   Labs Reviewed  CBC  COMPREHENSIVE METABOLIC PANEL  URINALYSIS, ROUTINE W REFLEX MICROSCOPIC  TROPONIN I    Results for orders placed during the hospital encounter of 11/10/11  CBC      Component Value Range   WBC 11.9 (*) 4.0 - 10.5 K/uL   RBC 4.10 (*) 4.22 - 5.81 MIL/uL   Hemoglobin 13.1  13.0 - 17.0 g/dL   HCT 16.1 (*) 09.6 - 04.5 %   MCV 93.9  78.0 - 100.0 fL   MCH 32.0  26.0 - 34.0 pg   MCHC 34.0  30.0 - 36.0 g/dL   RDW 40.9  81.1 - 91.4 %   Platelets 179  150 - 400 K/uL  COMPREHENSIVE METABOLIC PANEL      Component Value Range   Sodium 133 (*) 135 - 145 mEq/L   Potassium 3.7  3.5 - 5.1 mEq/L   Chloride 99  96 - 112 mEq/L   CO2 23  19 - 32 mEq/L   Glucose, Bld 104 (*) 70 - 99 mg/dL   BUN 19  6 - 23 mg/dL   Creatinine, Ser 7.82  0.50 - 1.35 mg/dL    Calcium 9.2  8.4 - 95.6 mg/dL   Total Protein 6.5  6.0 - 8.3 g/dL   Albumin 3.0 (*) 3.5 - 5.2 g/dL   AST 14  0 - 37 U/L   ALT 10  0 - 53 U/L   Alkaline Phosphatase 61  39 -  117 U/L   Total Bilirubin 0.7  0.3 - 1.2 mg/dL   GFR calc non Af Amer 70 (*) >90 mL/min   GFR calc Af Amer 81 (*) >90 mL/min  URINALYSIS, ROUTINE W REFLEX MICROSCOPIC      Component Value Range   Color, Urine YELLOW  YELLOW   APPearance CLEAR  CLEAR   Specific Gravity, Urine 1.019  1.005 - 1.030   pH 7.0  5.0 - 8.0   Glucose, UA NEGATIVE  NEGATIVE mg/dL   Hgb urine dipstick NEGATIVE  NEGATIVE   Bilirubin Urine NEGATIVE  NEGATIVE   Ketones, ur NEGATIVE  NEGATIVE mg/dL   Protein, ur NEGATIVE  NEGATIVE mg/dL   Urobilinogen, UA 0.2  0.0 - 1.0 mg/dL   Nitrite NEGATIVE  NEGATIVE   Leukocytes, UA NEGATIVE  NEGATIVE  TROPONIN I      Component Value Range   Troponin I <0.30  <0.30 ng/mL      MDM  Iv ns bolus. Labs.   Reviewed nursing notes and prior charts for additional history.    Date: 11/10/2011  Rate: 80  Rhythm: pacemaker rhythm  QRS Axis: normal  Intervals: normal  ST/T Wave abnormalities: nonspecific ST/T changes  Conduction Disutrbances:none  Narrative Interpretation:   Old EKG Reviewed: unchanged   Pt remains awake and alert. Pt denies any pain. No nv. Denies fever or chills.  abd soft nt. Has received ivf in ed.  Po fluids/meal.  Pt appears stable for d/c, rec close pcp f/u.       Suzi Roots, MD 11/10/11 [phone]

## 2011-11-10 NOTE — ED Notes (Signed)
DGU:[id number]<KV> Expected date:11/10/11<BR> Expected time:12:03 PM<BR> Means of arrival:Ambulance<BR> Comments:<BR> 89yoM, generalized weakness

## 2011-11-10 NOTE — Progress Notes (Signed)
NO pcp listed Cm inquired and was informed pcp is [employer], [employer] EPIC updated

## 2011-11-10 NOTE — Progress Notes (Signed)
Pt/family provided with [facility] contact information to reach [facility] as needed. Discussed that agency standardly response in 24 hours

## 2011-11-16 ENCOUNTER — Emergency Department (HOSPITAL_COMMUNITY): Payer: Medicare Other

## 2011-11-16 ENCOUNTER — Encounter (HOSPITAL_COMMUNITY): Payer: Self-pay | Admitting: Emergency Medicine

## 2011-11-16 ENCOUNTER — Inpatient Hospital Stay (HOSPITAL_COMMUNITY)
Admission: EM | Admit: 2011-11-16 | Discharge: 2011-11-18 | DRG: 884 | Disposition: A | Discharge status: Home Health | Payer: Medicare Other | Attending: Internal Medicine | Admitting: Internal Medicine

## 2011-11-16 DIAGNOSIS — Z950 Presence of cardiac pacemaker: Secondary | ICD-10-CM

## 2011-11-16 DIAGNOSIS — R627 Adult failure to thrive: Secondary | ICD-10-CM | POA: Diagnosis present

## 2011-11-16 DIAGNOSIS — E46 Unspecified protein-calorie malnutrition: Secondary | ICD-10-CM

## 2011-11-16 DIAGNOSIS — R5381 Other malaise: Secondary | ICD-10-CM | POA: Diagnosis present

## 2011-11-16 DIAGNOSIS — N390 Urinary tract infection, site not specified: Secondary | ICD-10-CM

## 2011-11-16 DIAGNOSIS — I10 Essential (primary) hypertension: Secondary | ICD-10-CM | POA: Diagnosis present

## 2011-11-16 DIAGNOSIS — Z8679 Personal history of other diseases of the circulatory system: Secondary | ICD-10-CM

## 2011-11-16 DIAGNOSIS — F03918 Unspecified dementia, unspecified severity, with other behavioral disturbance: Principal | ICD-10-CM

## 2011-11-16 DIAGNOSIS — F0391 Unspecified dementia with behavioral disturbance: Secondary | ICD-10-CM | POA: Diagnosis present

## 2011-11-16 DIAGNOSIS — I509 Heart failure, unspecified: Secondary | ICD-10-CM | POA: Diagnosis present

## 2011-11-16 DIAGNOSIS — R531 Weakness: Secondary | ICD-10-CM | POA: Diagnosis present

## 2011-11-16 DIAGNOSIS — R5383 Other fatigue: Secondary | ICD-10-CM

## 2011-11-16 HISTORY — DX: Essential (primary) hypertension: I10

## 2011-11-16 HISTORY — DX: Dementia in other diseases classified elsewhere without behavioral disturbance: F02.80

## 2011-11-16 HISTORY — DX: Unspecified fracture of right femur, initial encounter for closed fracture: S72.91XA

## 2011-11-16 HISTORY — DX: Presence of cardiac pacemaker: Z95.0

## 2011-11-16 HISTORY — DX: Increased secretion of gastrin: E16.4

## 2011-11-16 HISTORY — DX: Alzheimer's disease, unspecified: G30.9

## 2011-11-16 HISTORY — DX: Calculus of kidney: N20.0

## 2011-11-16 LAB — COMPREHENSIVE METABOLIC PANEL
ALT: 12 U/L (ref 0–53)
Alkaline Phosphatase: 60 U/L (ref 39–117)
BUN: 20 mg/dL (ref 6–23)
CO2: 25 mEq/L (ref 19–32)
Chloride: 103 mEq/L (ref 96–112)
GFR calc Af Amer: 83 mL/min — ABNORMAL LOW (ref >90)
GFR calc non Af Amer: 71 mL/min — ABNORMAL LOW (ref >90)
Glucose, Bld: 104 mg/dL — ABNORMAL HIGH (ref 70–99)
Potassium: 3.8 mEq/L (ref 3.5–5.1)
Sodium: 136 mEq/L (ref 135–145)
Total Bilirubin: 0.4 mg/dL (ref 0.3–1.2)

## 2011-11-16 LAB — URINE MICROSCOPIC-ADD ON

## 2011-11-16 LAB — URINALYSIS, ROUTINE W REFLEX MICROSCOPIC
Glucose, UA: NEGATIVE mg/dL
Ketones, ur: NEGATIVE mg/dL
Nitrite: NEGATIVE
pH: 7 (ref 5.0–8.0)

## 2011-11-16 LAB — CBC WITH DIFFERENTIAL/PLATELET
Eosinophils Absolute: 0.3 10*3/uL (ref 0.0–0.7)
Hemoglobin: 13 g/dL (ref 13.0–17.0)
Lymphocytes Relative: 15 % (ref 12–46)
Lymphs Abs: 1.2 10*3/uL (ref 0.7–4.0)
MCH: 32.2 pg (ref 26.0–34.0)
Monocytes Relative: 9 % (ref 3–12)
Neutro Abs: 5.5 10*3/uL (ref 1.7–7.7)
Neutrophils Relative %: 71 % (ref 43–77)
Platelets: 266 10*3/uL (ref 150–400)
RBC: 4.04 MIL/uL — ABNORMAL LOW (ref 4.22–5.81)
WBC: 7.7 10*3/uL (ref 4.0–10.5)

## 2011-11-16 LAB — CK TOTAL AND CKMB (NOT AT ARMC)
CK, MB: 1.8 ng/mL (ref 0.3–4.0)
Relative Index: INVALID (ref 0.0–2.5)

## 2011-11-16 LAB — TROPONIN I: Troponin I: <0.3 ng/mL (ref <0.30)

## 2011-11-16 MED ORDER — ACETAMINOPHEN 500 MG PO TABS: 1000.0000 mg | ORAL_TABLET | Freq: Four times a day (QID) | ORAL | Status: DC | PRN

## 2011-11-16 MED ORDER — CALCIUM CARBONATE-VITAMIN D 600-400 MG-UNIT PO TABS: 1.0000 | ORAL_TABLET | Freq: Every day | ORAL | Status: DC

## 2011-11-16 MED ORDER — ASPIRIN 81 MG PO CHEW
81.0000 mg | CHEWABLE_TABLET | Freq: Every day | ORAL | Status: DC
  Administered 2011-11-16 – 2011-11-18 (×3): 81 mg via ORAL
  Filled 2011-11-16 (×3): qty 1

## 2011-11-16 MED ORDER — RIVASTIGMINE 13.3 MG/24HR TD PT24: 13.3000 mg | MEDICATED_PATCH | Freq: Every day | TRANSDERMAL | Status: DC

## 2011-11-16 MED ORDER — RIVASTIGMINE 13.3 MG/24HR TD PT24
13.3000 mg/d | MEDICATED_PATCH | Freq: Every day | TRANSDERMAL | Status: DC
  Administered 2011-11-17: 13.3 mg/d via TRANSDERMAL

## 2011-11-16 MED ORDER — OMEGA-3-ACID ETHYL ESTERS 1 G PO CAPS
1.0000 g | ORAL_CAPSULE | Freq: Two times a day (BID) | ORAL | Status: DC
  Administered 2011-11-16 – 2011-11-18 (×4): 1 g via ORAL
  Filled 2011-11-16 (×5): qty 1

## 2011-11-16 MED ORDER — ENOXAPARIN SODIUM 40 MG/0.4ML ~~LOC~~ SOLN
40.0000 mg | SUBCUTANEOUS | Status: DC
  Administered 2011-11-16 – 2011-11-17 (×2): 40 mg via SUBCUTANEOUS
  Filled 2011-11-16 (×3): qty 0.4

## 2011-11-16 MED ORDER — PANTOPRAZOLE SODIUM 40 MG PO TBEC
40.0000 mg | DELAYED_RELEASE_TABLET | Freq: Every day | ORAL | Status: DC
  Administered 2011-11-16 – 2011-11-18 (×3): 40 mg via ORAL
  Filled 2011-11-16 (×3): qty 1

## 2011-11-16 MED ORDER — TRAMADOL HCL 50 MG PO TABS: 50.0000 mg | ORAL_TABLET | Freq: Four times a day (QID) | ORAL | Status: DC | PRN

## 2011-11-16 MED ORDER — ASPIRIN 81 MG PO TABS: 81.0000 mg | ORAL_TABLET | Freq: Every day | ORAL | Status: DC

## 2011-11-16 MED ORDER — METOPROLOL TARTRATE 12.5 MG HALF TABLET
12.5000 mg | ORAL_TABLET | Freq: Every day | ORAL | Status: DC
  Administered 2011-11-17 – 2011-11-18 (×2): 12.5 mg via ORAL
  Filled 2011-11-16 (×2): qty 1

## 2011-11-16 MED ORDER — LORATADINE 10 MG PO TABS
10.0000 mg | ORAL_TABLET | Freq: Every day | ORAL | Status: DC
Start: 2011-11-17 — End: 2011-11-18
  Administered 2011-11-17 – 2011-11-18 (×2): 10 mg via ORAL
  Filled 2011-11-16 (×2): qty 1

## 2011-11-16 MED ORDER — CALCIUM CARBONATE-VITAMIN D 500-200 MG-UNIT PO TABS
1.0000 | ORAL_TABLET | Freq: Every day | ORAL | Status: DC
  Administered 2011-11-16 – 2011-11-18 (×3): 1 via ORAL
  Filled 2011-11-16 (×3): qty 1

## 2011-11-16 MED ORDER — SODIUM CHLORIDE 0.9 % IJ SOLN: 3.0000 mL | INTRAMUSCULAR | Status: DC | PRN

## 2011-11-16 MED ORDER — NON FORMULARY: 13.3000 mg/d | Freq: Every day | Status: DC

## 2011-11-16 MED ORDER — ELDERTONIC PO ELIX
15.0000 mL | ORAL_SOLUTION | Freq: Every day | ORAL | Status: DC
  Administered 2011-11-16 – 2011-11-18 (×3): 15 mL via ORAL
  Filled 2011-11-16 (×3): qty 15

## 2011-11-16 MED ORDER — ONDANSETRON HCL 4 MG/2ML IJ SOLN: 4.0000 mg | Freq: Four times a day (QID) | INTRAMUSCULAR | Status: DC | PRN

## 2011-11-16 MED ORDER — SODIUM CHLORIDE 0.9 % IJ SOLN
3.0000 mL | Freq: Two times a day (BID) | INTRAMUSCULAR | Status: DC
  Administered 2011-11-16 – 2011-11-18 (×4): 3 mL via INTRAVENOUS

## 2011-11-16 MED ORDER — SODIUM CHLORIDE 0.9 % IV SOLN: Freq: Once | INTRAVENOUS | Status: DC

## 2011-11-16 MED ORDER — SODIUM CHLORIDE 0.9 % IV SOLN: 250.0000 mL | INTRAVENOUS | Status: DC | PRN

## 2011-11-16 MED ORDER — MEMANTINE HCL 10 MG PO TABS
10.0000 mg | ORAL_TABLET | Freq: Two times a day (BID) | ORAL | Status: DC
  Administered 2011-11-16 – 2011-11-18 (×4): 10 mg via ORAL
  Filled 2011-11-16 (×5): qty 1

## 2011-11-16 MED ORDER — ONDANSETRON HCL 4 MG PO TABS: 4.0000 mg | ORAL_TABLET | Freq: Four times a day (QID) | ORAL | Status: DC | PRN

## 2011-11-16 MED ORDER — SUPER OMEGA 3 EPA/DHA 1000 MG PO CAPS: 1.0000 | ORAL_CAPSULE | Freq: Two times a day (BID) | ORAL | Status: DC

## 2011-11-16 NOTE — Progress Notes (Signed)
Noted foley insertion order for 9Am when patient arrived on the unit after 4pm no foley inplace, called Dr. Darnelle Catalan to verify order and she stated not to insert foley.

## 2011-11-16 NOTE — ED Notes (Addendum)
Patient transported to X-ray 

## 2011-11-16 NOTE — Progress Notes (Signed)
Pacer interrogated, functioning normally, we will be available if needed.   Corine Shelter PA-C 11/16/2011 6:11 PM

## 2011-11-16 NOTE — ED Notes (Signed)
EMS pt from home and told EMS "I just don't feel well". Pt was hypertensive at home. EMS BP 158/98. Pt treated and released 2 weeks ago from [facility] for weakness.

## 2011-11-16 NOTE — H&P (Signed)
Hospital Admission Note Date: 11/16/2011  Patient name: Douglas Tucker Medical record number: [id number] Date of birth: 29-Aug-1918 Age: 78 y.o. Gender: male PCP: [name], MD Cardiologist: Dr. Alanda Amass  Attending physician: Maryruth Bun Maelin Kurkowski, MD Emergency Contact:  Brayten Komar (wife) [phone] (h) Code Status: Full (confirmed with daughter, who is POA)  Chief Complaint: Weakness  History of Present Illness: Douglas Tucker is an 78 y.o. male with a PMH of senile dementia, CHF, CAD, pacemaker placement who was brought to the hospital by his family with concerns about his blood pressure, and lack of sleep with resultant weakness and concerns that he is at risk for fall.  Wife reports chronic hallucinations and restless behavior at night.  Symptoms worse with "mind altering drugs" (i.e. Sleeping pills).  No fever or chills.  No complaints of physical discomfort except occasional myalgias, back pain.  Family reports dyspnea on exertion but no cough.  The family describes a generalized decline in his condition, activity tolerance and is requesting that we "check his pacemaker".  Past Medical History Past Medical History  Diagnosis Date  . Senile dementia, uncomplicated   . Congestive heart failure, unspecified   . Closed fracture of surgical neck of humerus   . Coronary atherosclerosis of native coronary artery   . Malignant neoplasm of prostate   . Dementia, unspecified, without behavioral disturbance   . Anemia, unspecified   . Other and unspecified hyperlipidemia   . Abnormality of secretion of gastrin   . Femur fracture, right   . Pacemaker   . Zollinger-Ellison syndrome     History of  . Hypertension   . Alzheimer's dementia 2007  . Nephrolithiasis     Past Surgical History Past Surgical History  Procedure Date  . Cataract extraction, bilateral   . Hemorrhoid surgery   . Bilateral inguinal hernia repair   . Tonsillectomy   . Hernia repair   . Joint replacement     Left total hip  . Coronary artery bypass graft 1999    x 3 vessels  . Eye surgery   . Cryoblation of prostate 09/2004  . Cardiac catheterization 06/2006  . Coronary angioplasty 1991  . Insert / replace / remove pacemaker 01/2007  . Orif right femoral neck fracture 07/25/2010    Meds: Prior to Admission medications   Medication Sig Start Date End Date Taking? Authorizing Provider  acetaminophen (TYLENOL) 500 MG tablet Take 1,000 mg by mouth every 6 (six) hours as needed. For pain   Yes Historical Provider, MD  aspirin 81 MG tablet Take 81 mg by mouth daily.     Yes Historical Provider, MD  Calcium Carbonate-Vitamin D (CALTRATE 600+D) 600-400 MG-UNIT per tablet Take 1 tablet by mouth daily.    Yes Historical Provider, MD  loratadine (CLARITIN) 10 MG tablet Take 10 mg by mouth daily.     Yes Historical Provider, MD  memantine (NAMENDA) 10 MG tablet Take 10 mg by mouth 2 (two) times daily.    Yes Historical Provider, MD  metoprolol tartrate (LOPRESSOR) 25 MG tablet Take 12.5 mg by mouth daily with breakfast.    Yes Historical Provider, MD  midodrine (PROAMATINE) 5 MG tablet Take 5 mg by mouth 2 (two) times daily as needed. If blood pressure goes under 110   Yes Historical Provider, MD  Multiple Vitamins-Minerals (ELDERTONIC PO) Take 15 mLs by mouth 2 (two) times daily.    Yes Historical Provider, MD  Omega-3 Fatty Acids (SUPER OMEGA 3 EPA/DHA) 1000 MG CAPS  Take 1 capsule by mouth 2 (two) times daily.    Yes Historical Provider, MD  omeprazole (PRILOSEC) 20 MG capsule Take 20 mg by mouth 2 (two) times daily.   Yes Historical Provider, MD  Rivastigmine (EXELON) 13.3 MG/24HR PT24 Place 13.3 mg onto the skin daily.   Yes Historical Provider, MD  traMADol (ULTRAM) 50 MG tablet Take 50 mg by mouth every 6 (six) hours as needed. For pain   Yes Historical Provider, MD    Allergies: Review of patient's allergies indicates no known allergies.  Social History: History   Social History  . Marital  Status: Married    Spouse Name: Douglas Tucker    Number of Children: 2  . Years of Education: N/A   Occupational History  . Retired, IT consultant    Social History Main Topics  . Smoking status: Former Smoker    Types: Cigarettes  . Smokeless tobacco: Never Used  . Alcohol Use: No  . Drug Use: No  . Sexually Active: No   Other Topics Concern  . Not on file   Social History Narrative   Married.  Lives with wife and has a 24 hour caregiver.  Normally able to ambulate with a walker.    Family History:  Family History  Problem Relation Age of Onset  . Heart failure Mother   . Stroke Father   . Stroke Brother   . Stroke Brother   . Heart failure Sister     Review of Systems: Constitutional: No fever, no chills;  Appetite fair; + weight loss of 15-20 lbs in 6 months, +activity intolerance, no weight gain.  HEENT: No blurry vision, no diplopia, no pharyngitis, + chronic rhinitis, ? dysphagia, wears glasses CV: No chest pain, no palpitations.  Resp: + SOB with exertion, no cough. GI: + nausea and occasional vomiting, no diarrhea, no melena, no hematochezia.  GU: No dysuria, no hematuria.  MSK: no myalgias, no arthralgias, +right flank pain.  Neuro:  No headache, no focal neurological deficits, no history of seizures.  Psych: No depression, no anxiety.  Endo: No thyroid disease, no DM, no heat intolerance, no cold intolerance, no polyuria, no polydipsia  Skin: No rashes, no skin lesions.  Heme: No easy bruising, no history of blood diseases.   Physical Exam: Blood pressure 144/86, pulse 75, temperature 97.6 F (36.4 C), temperature source Oral, resp. rate 22, SpO2 100.00%. BP 144/86  Pulse 75  Temp 97.6 F (36.4 C) (Oral)  Resp 22  SpO2 100%  General Appearance:    Alert, cooperative, no distress, appears stated age  Head:    Normocephalic, without obvious abnormality, atraumatic  Eyes:    PERRL, conjunctiva/corneas clear, EOM's intact  Ears:    Normal external ear canals,  both ears  Nose:   Nares normal, septum midline, mucosa normal, no drainage    or sinus tenderness  Throat:   Lips, mucosa, and tongue normal; teeth fair  Neck:   Supple, symmetrical, trachea midline, no adenopathy;       thyroid:  No enlargement/tenderness/nodules; no carotid   bruit or JVD  Back:     Symmetric, no curvature, ROM normal, no CVA tenderness  Lungs:     Lungs diminished, respirations unlabored  Chest wall:    No tenderness or deformity  Heart:    Regular rate and rhythm, S1 and S2 normal, no murmur, rub   or gallop  Abdomen:     Soft, non-tender, bowel sounds active all four quadrants,  no masses, no organomegaly  Extremities:   Extremities normal, atraumatic, no cyanosis or edema  Pulses:   2+ and symmetric all extremities  Skin:   Skin color, texture, turgor normal, no rashes or lesions  Lymph nodes:   Cervical, supraclavicular, and axillary nodes normal  Neurologic:   CNII-XII intact. A + O x 1, generalized weakness   Lab results: Basic Metabolic Panel:  Lab 11/16/11 1610 11/10/11 1241  NA 136 133*  K 3.8 3.7  CL 103 99  CO2 25 23  GLUCOSE 104* 104*  BUN 20 19  CREATININE 0.94 0.99  CALCIUM 9.0 9.2  MG -- --  PHOS -- --   GFR CrCl is unknown because there is no height on file for the current visit. Liver Function Tests:  Lab 11/16/11 1000 11/10/11 1241  AST 15 14  ALT 12 10  ALKPHOS 60 61  BILITOT 0.4 0.7  PROT 6.5 6.5  ALBUMIN 2.8* 3.0*   CBC:  Lab 11/16/11 1000 11/10/11 1241  WBC 7.7 11.9*  NEUTROABS 5.5 --  HGB 13.0 13.1  HCT 37.5* 38.5*  MCV 92.8 93.9  PLT 266 179   Cardiac Enzymes:  Lab 11/16/11 1000 11/10/11 1241  CKTOTAL 39 --  CKMB 1.8 --  CKMBINDEX -- --  TROPONINI <0.30 <0.30   Urinalysis    Component Value Date/Time   COLORURINE YELLOW 11/16/2011 1025   APPEARANCEUR CLEAR 11/16/2011 1025   LABSPEC 1.023 11/16/2011 1025   PHURINE 7.0 11/16/2011 1025   GLUCOSEU NEGATIVE 11/16/2011 1025   HGBUR NEGATIVE 11/16/2011 1025    BILIRUBINUR NEGATIVE 11/16/2011 1025   KETONESUR NEGATIVE 11/16/2011 1025   PROTEINUR NEGATIVE 11/16/2011 1025   UROBILINOGEN 0.2 11/16/2011 1025   NITRITE NEGATIVE 11/16/2011 1025   LEUKOCYTESUR TRACE* 11/16/2011 1025    Imaging results:  Dg Chest 2 View  11/16/2011  *RADIOLOGY REPORT*  Clinical Data: Hypertension.  CHEST - 2 VIEW  Comparison: Previous examinations, the most recent dated 11/10/2011.  Findings: Improved inspiration.  No gross change in mild enlargement of the cardiac silhouette, post CABG changes and left subclavian pacer leads.  Clear lungs.  Thoracic spine degenerative changes, including changes of DISH.  IMPRESSION: Stable mild cardiomegaly.  No acute abnormality.  Original Report Authenticated By: Darrol Angel, M.D.   Ct Head Wo Contrast  11/16/2011  *RADIOLOGY REPORT*  Clinical Data: Weakness for 2 days.  Confusion.  History of dementia.  CT HEAD WITHOUT CONTRAST  Technique:  Contiguous axial images were obtained from the base of the skull through the vertex without contrast.  Comparison: 09/10/2010  Findings: Bone windows demonstrate cerumen within the left external ear canal. Clear paranasal sinuses and mastoid air cells.  Soft tissue windows demonstrate expected cerebral atrophy. Moderate low density in the periventricular white matter likely related to small vessel disease.This may be slightly increased.  No  mass lesion, hemorrhage, hydrocephalus, acute infarct, intra- axial, or extra-axial fluid collection.  IMPRESSION:  1. No acute intracranial abnormality. 2. Cerebral atrophy and small vessel ischemic change.  Original Report Authenticated By: Consuello Bossier, M.D.    Assessment & Plan: Principal Problem:  *Generalized weakness with failure to thrive  Likely secondary to progression of dementia.  Disease process discussed with family.  Check TSH, B12 to rule out any treatable causes of weakness.  Check pro BNP to rule out subtle CHF.  Interrogate pacemaker to ensure proper  functioning.  Admitted under observation status (explained implications of this to family)  PT/OT evaluation requested. Active  Problems:  Dementia with behavioral disturbance  Continue Namenda and Exelon.    Consider Seroquel Q HS if insomnia or hallucinations a problem.  Malnutrition with weight loss  Dietician consult requested.  H/O CHF  Check pro BNP.  No Echo on chart.  CXR clear, compensated clinically.  HTN (hypertension)  Continue metoprolol.  Check orthostatics.  H/O cardiac pacemaker  Discussed with cardiology PA who will have PM interrogated.  Prophylaxis: Lovenox for DVT prophylaxis.  Time Spent On Admission: 1 hour.  Lytle Malburg 11/16/2011, 5:36 PM Pager [phone]

## 2011-11-16 NOTE — ED Notes (Signed)
ZOX:[id number]<AV> Expected date:11/16/11<BR> Expected time: 8:43 AM<BR> Means of arrival:Ambulance<BR> Comments:<BR> 78yo,generalized weakness, HTN

## 2011-11-16 NOTE — ED Provider Notes (Addendum)
History     CSN: [id number]  Arrival date & time 11/16/11  4098   First MD Initiated Contact with Patient 11/16/11 [phone]      Chief Complaint  Patient presents with  . Hypertension    (Consider location/radiation/quality/duration/timing/severity/associated sxs/prior treatment) Patient is a 78 y.o. male presenting with hypertension. The history is provided by the patient.  Hypertension   patient here complaining of of weakness x2 days. He denies any fever, cough, dyspnea. No headache or neck pain or photophobia. No black or bloody stools. Denies any dysuria. No recent medication changes. There is no anorexia. Nothing makes her symptoms better or worse. Called EMS and was noted to have been hypotensive at home which has since resolved.  Past Medical History  Diagnosis Date  . Other and unspecified hyperlipidemia   . Senile dementia, uncomplicated   . Congestive heart failure, unspecified   . Closed fracture of surgical neck of humerus   . Coronary atherosclerosis of native coronary artery   . Malignant neoplasm of prostate   . Dementia, unspecified, without behavioral disturbance   . Anemia, unspecified   . Other and unspecified hyperlipidemia   . Abnormality of secretion of gastrin     History reviewed. No pertinent past surgical history.  No family history on file.  History  Substance Use Topics  . Smoking status: Never Smoker   . Smokeless tobacco: Never Used  . Alcohol Use: No      Review of Systems  All other systems reviewed and are negative.    Allergies  Review of patient's allergies indicates no known allergies.  Home Medications   Current Outpatient Rx  Name Route Sig Dispense Refill  . ACETAMINOPHEN 500 MG PO TABS Oral Take 1,000 mg by mouth every 6 (six) hours as needed. For pain    . ASPIRIN 81 MG PO TABS Oral Take 81 mg by mouth daily.      Marland Kitchen CALCIUM CARBONATE-VITAMIN D 600-400 MG-UNIT PO TABS Oral Take 1 tablet by mouth daily.     Marland Kitchen LORATADINE  10 MG PO TABS Oral Take 10 mg by mouth daily.      Marland Kitchen MEMANTINE HCL 10 MG PO TABS Oral Take 10 mg by mouth 2 (two) times daily.     Marland Kitchen METHOCARBAMOL 500 MG PO TABS Oral Take 500 mg by mouth 4 (four) times daily as needed. For muscle spasms    . METOPROLOL TARTRATE 25 MG PO TABS Oral Take 12.5 mg by mouth daily with breakfast.     . MIDODRINE HCL 5 MG PO TABS Oral Take 5 mg by mouth 2 (two) times daily as needed. If blood pressure goes under 110    . ELDERTONIC PO Oral Take 15 mLs by mouth 2 (two) times daily.     . SUPER OMEGA 3 EPA/DHA 1000 MG PO CAPS Oral Take 1 capsule by mouth 2 (two) times daily.     Marland Kitchen OMEPRAZOLE 20 MG PO CPDR Oral Take 20 mg by mouth 2 (two) times daily.    Marland Kitchen RIVASTIGMINE 13.3 MG/24HR TD PT24 Transdermal Place 13.3 mg onto the skin daily.    . TRAMADOL HCL 50 MG PO TABS Oral Take 50 mg by mouth every 6 (six) hours as needed. For pain      BP 140/93  Pulse 97  Temp 98.2 F (36.8 C)  Resp 18  SpO2 97%  Physical Exam  Nursing note and vitals reviewed. Constitutional: He is oriented to person, place, and time. He  appears well-developed and well-nourished.  Non-toxic appearance. No distress.  HENT:  Head: Normocephalic and atraumatic.  Eyes: Conjunctivae, EOM and lids are normal. Pupils are equal, round, and reactive to light.  Neck: Normal range of motion. Neck supple. No tracheal deviation present. No mass present.  Cardiovascular: Normal rate, regular rhythm and normal heart sounds.  Exam reveals no gallop.   No murmur heard. Pulmonary/Chest: Effort normal and breath sounds normal. No stridor. No respiratory distress. He has no decreased breath sounds. He has no wheezes. He has no rhonchi. He has no rales.  Abdominal: Soft. Normal appearance and bowel sounds are normal. He exhibits no distension. There is no tenderness. There is no rigidity, no rebound, no guarding and no CVA tenderness.  Musculoskeletal: Normal range of motion. He exhibits no edema and no tenderness.   Neurological: He is alert and oriented to person, place, and time. He has normal strength. No cranial nerve deficit or sensory deficit. GCS eye subscore is 4. GCS verbal subscore is 5. GCS motor subscore is 6.  Skin: Skin is warm and dry. No abrasion and no rash noted.  Psychiatric: His speech is normal. His affect is blunt. He is slowed.    ED Course  Procedures (including critical care time)   Labs Reviewed  CBC WITH DIFFERENTIAL  COMPREHENSIVE METABOLIC PANEL  URINE CULTURE  URINALYSIS, ROUTINE W REFLEX MICROSCOPIC   No results found.   No diagnosis found.    MDM   Date: 11/16/2011  Rate: 82  Rhythm: 82  QRS Axis: indeterminate  Intervals: paced  ST/T Wave abnormalities: nonspecific ST changes  Conduction Disutrbances:paced  Narrative Interpretation:   Old EKG Reviewed: unchanged  11:42 AM Patient. Assessed and I spoke with his family. He notes that he has had weakness which is been intermittent and that he is able to be cared for at home at this time. We'll have hospitalist come and see          Toy Baker, MD 11/16/11 1457  Toy Baker, MD 11/16/11 [phone]

## 2011-11-16 NOTE — ED Notes (Signed)
Called report to [place] on 3 west.

## 2011-11-16 NOTE — ED Notes (Addendum)
Pt back to room from xray.

## 2011-11-16 NOTE — ED Notes (Addendum)
Pt has pacemaker EMS reported HR was 60's on arrival and EMS reported HR was 117 in route.

## 2011-11-17 DIAGNOSIS — E46 Unspecified protein-calorie malnutrition: Secondary | ICD-10-CM

## 2011-11-17 DIAGNOSIS — F0391 Unspecified dementia with behavioral disturbance: Principal | ICD-10-CM

## 2011-11-17 MED ORDER — BOOST PLUS PO LIQD
237.0000 mL | Freq: Two times a day (BID) | ORAL | Status: DC
  Filled 2011-11-17 (×3): qty 237

## 2011-11-17 NOTE — Progress Notes (Signed)
11-17-11  NSG:  Pt has been confused, restless, attempting out of bed, since I came on at 1930.  Attempts to reorient (frequently) fail.  Vss.  He requires 2 MAX assist oob to bsc.

## 2011-11-17 NOTE — Progress Notes (Deleted)
   CARE MANAGEMENT NOTE 11/17/2011  Patient:  Douglas Tucker,Douglas Tucker   Account Number:  [id number]  Date Initiated:  11/17/2011  Documentation initiated by:  Julieanna Geraci  Subjective/Objective Assessment:   ADMITTED WITH CHF     Action/Plan:   PCP:   PHARR,WALTER DAVIDSON, MD  PATIENT IS Tucker RESIDENT OF [facility]   Anticipated DC Date:  11/24/2011   Anticipated DC Plan:  SKILLED NURSING FACILITY  In-house referral  Clinical Social Worker      DC Planning Services  CM consult           Status of service:  In process, will continue to follow Medicare Important Message given?  NA - LOS <3 / Initial given by admissions (If response is "NO", the following Medicare IM given date fields will be blank)  Per UR Regulation:  Reviewed for med. necessity/level of care/duration of stay  Comments:  11/17/2011- B Reita Shindler RN, BSN, MHA  

## 2011-11-17 NOTE — Progress Notes (Signed)
[facility] PROGRESS NOTE  Douglas Tucker ZOX:[id number] DOB: May 03, 1919 DOA: 11/16/2011 PCP: [name], MD  Assessment/Plan: Principal Problem:  *Generalized weakness with failure to thrive Active Problems:  Dementia with behavioral disturbance  Malnutrition with weight loss  H/O CHF  HTN (hypertension)  H/O cardiac pacemaker   Generalized weakness with failure to thrive -Likely secondary to prevent progression of dementia, disease process discussed with the family. -Normal TSH, B12 and rest of the blood work so far. -Pacemaker interrogated in his functioning properly. -PT/OT recommended home health services.  Dementia with behavioral disturbances -Continue Namenda and Exelon, family mentioned that he have insomnia now sedation at nighttime. -Consider Seroquel of patient continues to have insomnia.  History of CHF -ProBNP is a slightly elevated, but patient does not have symptoms and signs of decompensation.  Hypertension -Continue metoprolol.  Code Status:  Family Communication: Discussed with his wife and his son. Disposition Plan:  home with home health services versus a skilled nursing facility.  Brief narrative:  78 year old Caucasian male came in to the hospital with generalized weakness.  Consultants:   none  Procedures:   pacemaker interrogation, showed properly functioning pacemaker  Antibiotics:   none  HPI/Subjective:  denies any chest pain, or shortness of breath. Family at bedside  Objective: Filed Vitals:   11/16/11 2231 11/16/11 2232 11/16/11 2235 11/17/11 1415  BP: 151/87 144/88 139/92 104/70  Pulse: 71 72 71 75  Temp: 98.7 F (37.1 C)   97.2 F (36.2 C)  TempSrc: Oral   Axillary  Resp: 20   18  Height:      Weight:      SpO2: 97% 97%  96%    Intake/Output Summary (Last 24 hours) at 11/17/11 1441 Last data filed at 11/17/11 0800  Gross per 24 hour  Intake    540 ml  Output    350 ml  Net    190 ml     Exam:  General: Alert and awake, oriented x3, not in any acute distress. HEENT: anicteric sclera, pupils reactive to light and accommodation, EOMI CVS: S1-S2 clear, no murmur rubs or gallops Chest: clear to auscultation bilaterally, no wheezing, rales or rhonchi Abdomen: soft nontender, nondistended, normal bowel sounds, no organomegaly Extremities: no cyanosis, clubbing or edema noted bilaterally Neuro: Cranial nerves II-XII intact, no focal neurological deficits  Data Reviewed: Basic Metabolic Panel:  Lab 11/16/11 8119  NA 136  K 3.8  CL 103  CO2 25  GLUCOSE 104*  BUN 20  CREATININE 0.94  CALCIUM 9.0  MG --  PHOS --   Liver Function Tests:  Lab 11/16/11 1000  AST 15  ALT 12  ALKPHOS 60  BILITOT 0.4  PROT 6.5  ALBUMIN 2.8*   No results found for this basename: LIPASE:5,AMYLASE:5 in the last 168 hours No results found for this basename: AMMONIA:5 in the last 168 hours CBC:  Lab 11/16/11 1000  WBC 7.7  NEUTROABS 5.5  HGB 13.0  HCT 37.5*  MCV 92.8  PLT 266   Cardiac Enzymes:  Lab 11/16/11 1000  CKTOTAL 39  CKMB 1.8  CKMBINDEX --  TROPONINI <0.30   BNP (last 3 results)  Basename 11/16/11 1000  PROBNP 2374.0*   CBG: No results found for this basename: GLUCAP:5 in the last 168 hours  Recent Results (from the past 240 hour(s))  URINE CULTURE     Status: Normal (Preliminary result)   Collection Time   11/16/11 10:25 AM      Component  Value Range Status Comment   Specimen Description URINE, CLEAN CATCH   Final    Special Requests NONE   Final    Culture  Setup Time 11/16/2011 14:09   Final    Colony Count PENDING   Incomplete    Culture Culture reincubated for better growth   Final    Report Status PENDING   Incomplete      Studies: Dg Chest 2 View  11/16/2011  *RADIOLOGY REPORT*  Clinical Data: Hypertension.  CHEST - 2 VIEW  Comparison: Previous examinations, the most recent dated 11/10/2011.  Findings: Improved inspiration.  No gross change  in mild enlargement of the cardiac silhouette, post CABG changes and left subclavian pacer leads.  Clear lungs.  Thoracic spine degenerative changes, including changes of DISH.  IMPRESSION: Stable mild cardiomegaly.  No acute abnormality.  Original Report Authenticated By: Darrol Angel, M.D.   Ct Head Wo Contrast  11/16/2011  *RADIOLOGY REPORT*  Clinical Data: Weakness for 2 days.  Confusion.  History of dementia.  CT HEAD WITHOUT CONTRAST  Technique:  Contiguous axial images were obtained from the base of the skull through the vertex without contrast.  Comparison: 09/10/2010  Findings: Bone windows demonstrate cerumen within the left external ear canal. Clear paranasal sinuses and mastoid air cells.  Soft tissue windows demonstrate expected cerebral atrophy. Moderate low density in the periventricular white matter likely related to small vessel disease.This may be slightly increased.  No  mass lesion, hemorrhage, hydrocephalus, acute infarct, intra- axial, or extra-axial fluid collection.  IMPRESSION:  1. No acute intracranial abnormality. 2. Cerebral atrophy and small vessel ischemic change.  Original Report Authenticated By: Consuello Bossier, M.D.   Dg Chest Port 1 View  11/10/2011  *RADIOLOGY REPORT*  Clinical Data: Shortness of breath, weakness  PORTABLE CHEST - 1 VIEW  Comparison: 07/25/2010  Findings: Cardiomediastinal silhouette is stable.  Mild hyperinflation again noted.  Status post CABG.  Dual lead cardiac pacemaker is unchanged in position.  No acute infiltrate or pulmonary edema.  IMPRESSION: No active disease.  Hyperinflation again noted.  Dual lead cardiac pacemaker is unchanged in position.  Original Report Authenticated By: Natasha Mead, M.D.    Scheduled Meds:   . aspirin  81 mg Oral Daily  . calcium-vitamin D  1 tablet Oral Daily  . enoxaparin (LOVENOX) injection  40 mg Subcutaneous Q24H  . geriatric multivitamins-minerals  15 mL Oral Daily  . lactose free nutrition  237 mL Oral BID BM   . loratadine  10 mg Oral Daily  . memantine  10 mg Oral BID  . metoprolol tartrate  12.5 mg Oral Daily  . omega-3 acid ethyl esters  1 g Oral BID  . pantoprazole  40 mg Oral Q1200  . Rivastigmine  13.3 mg/day Transdermal Daily  . sodium chloride  3 mL Intravenous Q12H  . DISCONTD: sodium chloride   Intravenous Once  . DISCONTD: aspirin  81 mg Oral Daily  . DISCONTD: Calcium Carbonate-Vitamin D  1 tablet Oral Daily  . DISCONTD: NON FORMULARY 13.3 mg/day  13.3 mg/day Transdermal Daily  . DISCONTD: Rivastigmine  13.3 mg Transdermal Daily  . DISCONTD: SUPER OMEGA 3 EPA/DHA  1 capsule Oral BID   Continuous Infusions:    [facility] Pager [phone]  If 7PM-7AM, please contact night-coverage [email or web address] Password [email or web address] 11/17/2011, 2:41 PM   LOS: 1 day

## 2011-11-17 NOTE — Progress Notes (Signed)
   CARE MANAGEMENT NOTE 11/17/2011  Patient:  Douglas Tucker, Douglas Tucker   Account Number:  [id number]  Date Initiated:  11/17/2011  Documentation initiated by:  Jiles Crocker  Subjective/Objective Assessment:   ADMITTED WITH WEAKNESS, DEMENTIA,     Action/Plan:   PCP: [name], MD  Cardiologist: Dr. Alanda Amass; PATIENT IS ACTIVE WITH [facility] FOR HOME HEALTH CARE AND HAS PRIVATE DUTY SERVICES TO ASSIST AT HOME WITH CARE.   Anticipated DC Date:  11/19/2011   Anticipated DC Plan:  HOME W HOME HEALTH SERVICES      DC Planning Services  CM consult            Status of service:  In process, will continue to follow Medicare Important Message given?  NA - LOS <3 / Initial given by admissions (If response is "NO", the following Medicare IM given date fields will be blank)  Per UR Regulation:  Reviewed for med. necessity/level of care/duration of stay  Comments:  11/17/2011- B Tanesha Arambula RN, BSN, MHA

## 2011-11-17 NOTE — Progress Notes (Signed)
INITIAL ADULT NUTRITION ASSESSMENT Date: 11/17/2011   Time: 10:14 AM Reason for Assessment: Consult for adult unintentional weight loss > 10 lb over 1 month  ASSESSMENT: Male 78 y.o.  Dx: Generalized weakness  Hx:  Past Medical History  Diagnosis Date  . Senile dementia, uncomplicated   . Congestive heart failure, unspecified   . Closed fracture of surgical neck of humerus   . Coronary atherosclerosis of native coronary artery   . Malignant neoplasm of prostate   . Dementia, unspecified, without behavioral disturbance   . Anemia, unspecified   . Other and unspecified hyperlipidemia   . Abnormality of secretion of gastrin   . Femur fracture, right   . Pacemaker   . Zollinger-Ellison syndrome     History of  . Hypertension   . Alzheimer's dementia 2007  . Nephrolithiasis     Related Meds:  Scheduled Meds:   . aspirin  81 mg Oral Daily  . calcium-vitamin D  1 tablet Oral Daily  . enoxaparin (LOVENOX) injection  40 mg Subcutaneous Q24H  . geriatric multivitamins-minerals  15 mL Oral Daily  . loratadine  10 mg Oral Daily  . memantine  10 mg Oral BID  . metoprolol tartrate  12.5 mg Oral Daily  . omega-3 acid ethyl esters  1 g Oral BID  . pantoprazole  40 mg Oral Q1200  . Rivastigmine  13.3 mg/day Transdermal Daily  . sodium chloride  3 mL Intravenous Q12H  . DISCONTD: sodium chloride   Intravenous Once  . DISCONTD: aspirin  81 mg Oral Daily  . DISCONTD: Calcium Carbonate-Vitamin D  1 tablet Oral Daily  . DISCONTD: NON FORMULARY 13.3 mg/day  13.3 mg/day Transdermal Daily  . DISCONTD: Rivastigmine  13.3 mg Transdermal Daily  . DISCONTD: SUPER OMEGA 3 EPA/DHA  1 capsule Oral BID   Continuous Infusions:  PRN Meds:.sodium chloride, acetaminophen, ondansetron (ZOFRAN) IV, ondansetron, sodium chloride, traMADol   Ht: 5\' 9"  (175.3 cm)  Wt: 153 lb 14.4 oz (69.809 kg)  Ideal Wt: 72.72 kg % Ideal Wt: 95.6% Wt Readings from Last 10 Encounters:  11/16/11 153 lb 14.4 oz  (69.809 kg)  11/10/11 170 lb (77.111 kg)  *Patient's family reported patient patient has lost some weight but unsure how much. Per MD note, patient has lost 15-20 lb over 6 months.   BMI: 22.59 kg/m^2 (WNL)  Food/Nutrition Related Hx: Patient's family reported he had a good appetite today. Prior to admission patient's intake was about 50% of meals. Patient's family reported he used to drink a nutrition supplements.   Labs:  CMP     Component Value Date/Time   NA 136 11/16/2011 1000   K 3.8 11/16/2011 1000   CL 103 11/16/2011 1000   CO2 25 11/16/2011 1000   GLUCOSE 104* 11/16/2011 1000   BUN 20 11/16/2011 1000   CREATININE 0.94 11/16/2011 1000   CALCIUM 9.0 11/16/2011 1000   PROT 6.5 11/16/2011 1000   ALBUMIN 2.8* 11/16/2011 1000   AST 15 11/16/2011 1000   ALT 12 11/16/2011 1000   ALKPHOS 60 11/16/2011 1000   BILITOT 0.4 11/16/2011 1000   GFRNONAA 71* 11/16/2011 1000   GFRAA 83* 11/16/2011 1000     Intake/Output Summary (Last 24 hours) at 11/17/11 1015 Last data filed at 11/17/11 0700  Gross per 24 hour  Intake    480 ml  Output    350 ml  Net    130 ml     Diet Order: Cardiac  Supplements/Tube Feeding: none  at this time  IVF:    Estimated Nutritional Needs:   Kcal: 1610-9604 Protein: 83-104 grams Fluid: 1 ml per kcal intake   NUTRITION DIAGNOSIS: -Inadequate oral intake (NI-2.1).  Status: Ongoing  RELATED TO: poor appetite  AS EVIDENCE BY: patient eating 50% of meals  MONITORING/EVALUATION(Goals): PO intake, weights, labs 1. PO intake > 75% at meals.  2. Minimize weight loss  EDUCATION NEEDS: -No education needs identified at this time  INTERVENTION: 1. Will order patient Boost BID. Provides 720 kcal and 28 grams of protein daily.  2. Will order patient magic cup once daily to increase PO intake.  3. RD available to follow for nutrition plan of care.   Dietitian [phone]  DOCUMENTATION CODES Per approved criteria  -Not Applicable    Iven Finn [facility] 11/17/2011,  10:14 AM

## 2011-11-17 NOTE — Evaluation (Signed)
Physical Therapy One Time Evaluation Patient Details Name: Douglas Tucker MRN: [id number] DOB: May 31, 1918 Today's Date: 11/17/2011 Time: 9562-1308 PT Time Calculation (min): 29 min  PT Assessment / Plan / Recommendation Clinical Impression  one time eval completed; no further needs this venue; pt will benefit frorm HHPT to maximize independence; pt has 24 sitters at home    PT Assessment  All further PT needs can be met in the next venue of care    Follow Up Recommendations  Home health PT    Barriers to Discharge        Equipment Recommendations  None recommended by OT;None recommended by PT    Recommendations for Other Services     Frequency      Precautions / Restrictions Precautions Precautions: Fall   Pertinent Vitals/Pain       Mobility  Bed Mobility Bed Mobility: Supine to Sit Supine to Sit: 1: +2 Total assist Supine to Sit: Patient Percentage: 50% Details for Bed Mobility Assistance: physical A needed for LEs and to bring trunk to upright position. Transfers Transfers: Sit to Stand;Stand to Sit Sit to Stand: 1: +2 Total assist;From bed;With upper extremity assist Sit to Stand: Patient Percentage: 70% Stand to Sit: 1: +2 Total assist;To chair/3-in-1;With upper extremity assist;With armrests Stand to Sit: Patient Percentage: 70% Stand Pivot Transfers: 1: +2 Total assist Stand Pivot Transfers: Patient Percentage: 70% Details for Transfer Assistance: Max VCs for safety manipulating RW, hand placement. stand step pivot with RW    Exercises     PT Diagnosis:    PT Problem List:   PT Treatment Interventions:     PT Goals    Visit Information  Last PT Received On: 11/17/11 Assistance Needed: +2 (for safety) PT/OT Co-Evaluation/Treatment: Yes    Subjective Data  Subjective: pt sleepin, family present Patient Stated Goal: none   Prior Functioning  Home Living Lives With: Spouse Available Help at Discharge: Family;Available 24 hours/day Type of Home:  House Home Access: Stairs to enter [employer] of Steps: 4 Entrance Stairs-Rails: Right Home Layout: One level Bathroom Shower/Tub: Health visitor: Standard Home Adaptive Equipment: Shower chair with back;Grab bars in shower;Wheelchair - manual;Walker - rolling;Other (comment) (gait belt.) Prior Function Level of Independence: Needs assistance Needs Assistance: Bathing;Dressing;Toileting;Meal Prep;Light Housekeeping;Grooming Bath: Total Dressing: Total Grooming: Minimal Toileting: Total Meal Prep: Total Light Housekeeping: Total Able to Take Stairs?: Yes Driving: No Vocation: Retired    Probation officer Status: History of cognitive impairments - at baseline Arousal/Alertness: Lethargic Orientation Level: Disoriented to;Place;Time;Situation Behavior During Session:  (arouses easily) Cognition - Other Comments: Pt is able to follow 1 step commands with increased time     Extremity/Trunk Assessment Right Upper Extremity Assessment RUE ROM/Strength/Tone: Unable to fully assess;Due to impaired cognition Left Upper Extremity Assessment LUE ROM/Strength/Tone: Unable to fully assess;Due to impaired cognition Right Lower Extremity Assessment RLE ROM/Strength/Tone:  (grossly WFL, unable to fully assess due to cognition)   Balance Balance Balance Assessed: Yes Static Sitting Balance Static Sitting - Balance Support: No upper extremity supported;Feet supported Static Sitting - Level of Assistance: 5: Stand by assistance  End of Session PT - End of Session Activity Tolerance: Patient limited by fatigue Patient left: in chair;with bed alarm set;with nursing in room Nurse Communication: Mobility status  GP Functional Assessment Tool Used: clinical judgement Functional Limitation: Mobility: Walking and moving around Mobility: Walking and Moving Around Current Status ([id number]): At least 20 percent but less than 40 percent impaired, limited or  restricted  Mobility: Walking and Moving Around Goal Status [phone]): At least 20 percent but less than 40 percent impaired, limited or restricted Mobility: Walking and Moving Around Discharge Status [phone]): At least 20 percent but less than 40 percent impaired, limited or restricted   [facility] 11/17/2011, 1:07 PM

## 2011-11-17 NOTE — Evaluation (Signed)
Occupational Therapy Evaluation Patient Details Name: Douglas Tucker MRN: [id number] DOB: [birth year] Today's Date: 11/17/2011 Time: 4098-1191 OT Time Calculation (min): 17 min  OT Assessment / Plan / Recommendation Clinical Impression  Pt is a 78 yo male with advanced dementia who has 24/7 caregivers at home. Pt appears to be functioning at baseline with regards to ADLs. No f/u needed.    OT Assessment  Patient does not need any further OT services    Follow Up Recommendations  No OT follow up    Barriers to Discharge      Equipment Recommendations  None recommended by OT    Recommendations for Other Services    Frequency       Precautions / Restrictions Precautions Precautions: Fall   Pertinent Vitals/Pain Pt did not appear to be in pain or distress.    ADL  Grooming: Performed;Wash/dry face;Set up Where Assessed - Grooming: Unsupported sitting Toilet Transfer: +2 Total assistance Toilet Transfer: Patient Percentage: 70% Toilet Transfer Method: Stand pivot Toilet Transfer Equipment: Other (comment) (to recliner.) Toileting - Clothing Manipulation and Hygiene: Performed;+2 Total assistance Toileting - Clothing Manipulation and Hygiene: Patient Percentage: 0% Where Assessed - Toileting Clothing Manipulation and Hygiene: Standing Equipment Used: Rolling walker;Gait belt Transfers/Ambulation Related to ADLs: Pt only agreeable to sit in chair. Max cues to initiate and followthrough with functional tasks.    OT Diagnosis:    OT Problem List:   OT Treatment Interventions:     OT Goals    Visit Information  Last OT Received On: 11/17/11 Assistance Needed: +2 (for safety) PT/OT Co-Evaluation/Treatment: Yes    Subjective Data  Subjective: I'll sit in that chair. Patient Stated Goal: Pt unable to state goal.   Prior Functioning  Home Living Lives With: Spouse Available Help at Discharge:  (Pt has sitters 24/7) Type of Home: House Home Access: Stairs to  enter [employer] of Steps: 4 Entrance Stairs-Rails: Right Home Layout: One level Bathroom Shower/Tub: Health visitor: Standard Home Adaptive Equipment: Shower chair with back;Grab bars in shower;Wheelchair - manual;Walker - rolling;Other (comment) (gait belt.) Prior Function Level of Independence: Needs assistance Needs Assistance: Bathing;Dressing;Toileting;Meal Prep;Light Housekeeping;Grooming Bath: Total Dressing: Total Grooming: Minimal Toileting: Total Meal Prep: Total Light Housekeeping: Total Driving: No Vocation: Retired    Probation officer Status: History of cognitive impairments - at baseline Arousal/Alertness: Lethargic Orientation Level: Disoriented to;Place;Time;Situation Behavior During Session: Lethargic Cognition - Other Comments: Pt is able to follow 1 step commands with increased time but inconsisitently.    Extremity/Trunk Assessment Right Upper Extremity Assessment RUE ROM/Strength/Tone: Unable to fully assess;Due to impaired cognition Left Upper Extremity Assessment LUE ROM/Strength/Tone: Unable to fully assess;Due to impaired cognition   Mobility Bed Mobility Bed Mobility: Supine to Sit Supine to Sit: 1: +2 Total assist Supine to Sit: Patient Percentage: 50% Details for Bed Mobility Assistance: physical A needed for LEs and to bring trunk to upright position. Transfers Transfers: Sit to Stand;Stand to Sit Sit to Stand: 1: +2 Total assist;From bed;With upper extremity assist Sit to Stand: Patient Percentage: 70% Stand to Sit: 1: +2 Total assist;To chair/3-in-1;With upper extremity assist;With armrests Stand to Sit: Patient Percentage: 70% Details for Transfer Assistance: Max VCs for safety manipulating RW, hand placement.    Exercise    Balance Balance Balance Assessed: Yes Static Sitting Balance Static Sitting - Balance Support: No upper extremity supported;Feet supported Static Sitting - Level of Assistance:  5: Stand by assistance  End of Session OT - End of Session  Equipment Utilized During Treatment: Gait belt Activity Tolerance: Patient limited by fatigue Patient left: in chair;with call bell/phone within reach Nurse Communication: Mobility status  GO Functional Assessment Tool Used: Clinical Judgement Functional Limitation: Self care Self Care Current Status (R6045): 100 percent impaired, limited or restricted Self Care Goal Status (W0981): 100 percent impaired, limited or restricted Self Care Discharge Status [phone]): 100 percent impaired, limited or restricted   Douglas Tucker A OTR/L [phone] 11/17/2011, 12:08 PM

## 2011-11-18 DIAGNOSIS — N390 Urinary tract infection, site not specified: Secondary | ICD-10-CM | POA: Diagnosis present

## 2011-11-18 DIAGNOSIS — Z8679 Personal history of other diseases of the circulatory system: Secondary | ICD-10-CM

## 2011-11-18 MED ORDER — DOXYCYCLINE HYCLATE 100 MG PO TABS: 100.0000 mg | ORAL_TABLET | Freq: Two times a day (BID) | ORAL | Status: AC

## 2011-11-18 NOTE — Progress Notes (Signed)
Report received on this pt and care assumed.  Notes reviewed.  It appears there was some concern about pt having insomnia.  Last night pt slept well all night per report and is still resting comfortably this morning.  Will continue to monitor.  Ardyth Gal, RN 11/18/2011

## 2011-11-18 NOTE — Discharge Summary (Signed)
Physician Discharge Summary  RONALD LONDO AVW:[id number] DOB: 13-Jun-1918 DOA: 11/16/2011  PCP: [name], MD  Admit date: 11/16/2011 Discharge date: 11/18/2011  Recommendations for Outpatient Follow-up:  1. Resume Home health services  Discharge Diagnoses:  Principal Problem:  *Generalized weakness with failure to thrive Active Problems:  Dementia with behavioral disturbance  Malnutrition with weight loss  H/O CHF  HTN (hypertension)  H/O cardiac pacemaker  UTI (lower urinary tract infection)  Principal diagnosis: Generalized weakness with failure to thrive  Discharge Condition: Stable  Diet recommendation: Regular diet  History of present illness:  Douglas Tucker is an 78 y.o. male with a PMH of senile dementia, CHF, CAD, pacemaker placement who was brought to the hospital by his family with concerns about his blood pressure, and lack of sleep with resultant weakness and concerns that he is at risk for fall. Wife reports chronic hallucinations and restless behavior at night. Symptoms worse with "mind altering drugs" (i.e. Sleeping pills). No fever or chills. No complaints of physical discomfort except occasional myalgias, back pain. Family reports dyspnea on exertion but no cough. The family describes a generalized decline in his condition, activity tolerance and is requesting that we "check his pacemaker".   Hospital Course:   1. Generalized weakness with failure to thrive: This is likely secondary to progression of his dementia, disease process was discussed with the family. Patient does have normal TSH and B12. 4 the generalized weakness also due to his pacemaker was interrogated and this is functioning properly. It might be also a mild UTI contributing to his problems. This will be treated with oral antibiotics. At the day of discharge patient was sitting, and feeding himself.  2. UTI: Patient never mentioned any urinary symptoms, his urinalysis did not show any pus cells,  but his urine culture grew staph species. Not sure of this full-blown UTI or just contamination, but because of the recent history of generalized weakness, him being a male I will treat for 5 days with antibiotics. Because of its staff I will give doxycycline for 5 more days.  3. Dementia with behavioral disturbances: Continue Namenda and Exelon,, mention that patient has insomnia and he did not tolerate sleeping aids before as he developed a hallucinations. Patient had could not sleep while is in the hospital, otherwise Seroquel can be consideration to at night.  4. Hypertension: This is controlled, continue metoprolol.  5. Disposition: Patient evaluated by PT/OT and they recommended physical therapy at home. Patient already has home health service I will resume that.  Procedures:  Permanent pacemaker interrogation: Showed properly functioning pacemaker  Consultations:  Recommendation from cardiology (not a full consult)  Discharge Exam: Filed Vitals:   11/18/11 0551  BP: 125/78  Pulse: 78  Temp: 98.2 F (36.8 C)  Resp: 18   Filed Vitals:   11/16/11 2235 11/17/11 1415 11/17/11 2206 11/18/11 0551  BP: 139/92 104/70 124/76 125/78  Pulse: 71 75 77 78  Temp:  97.2 F (36.2 C) 97.9 F (36.6 C) 98.2 F (36.8 C)  TempSrc:  Axillary Oral Oral  Resp:  18 18 18   Height:      Weight:      SpO2:  96% 94% 95%   General: Alert and awake, disoriented, not in any acute distress. HEENT: anicteric sclera, pupils reactive to light and accommodation, EOMI CVS: S1-S2 clear, no murmur rubs or gallops Chest: clear to auscultation bilaterally, no wheezing, rales or rhonchi Abdomen: soft nontender, nondistended, normal bowel sounds, no organomegaly Extremities: no  cyanosis, clubbing or edema noted bilaterally Neuro: Cranial nerves II-XII intact, no focal neurological deficits  Discharge Instructions  Discharge Orders    Future Orders Please Complete By Expires   Increase activity slowly         Medication List  As of 11/18/2011 12:31 PM   TAKE these medications         acetaminophen 500 MG tablet   Commonly known as: TYLENOL   Take 1,000 mg by mouth every 6 (six) hours as needed. For pain      aspirin 81 MG tablet   Take 81 mg by mouth daily.      CALTRATE 600+D 600-400 MG-UNIT per tablet   Generic drug: Calcium Carbonate-Vitamin D   Take 1 tablet by mouth daily.      doxycycline 100 MG tablet   Commonly known as: VIBRA-TABS   Take 1 tablet (100 mg total) by mouth 2 (two) times daily.      ELDERTONIC PO   Take 15 mLs by mouth 2 (two) times daily.      EXELON 13.3 MG/24HR Pt24   Generic drug: Rivastigmine   Place 13.3 mg onto the skin daily.      loratadine 10 MG tablet   Commonly known as: CLARITIN   Take 10 mg by mouth daily.      memantine 10 MG tablet   Commonly known as: NAMENDA   Take 10 mg by mouth 2 (two) times daily.      metoprolol tartrate 25 MG tablet   Commonly known as: LOPRESSOR   Take 12.5 mg by mouth daily with breakfast.      midodrine 5 MG tablet   Commonly known as: PROAMATINE   Take 5 mg by mouth 2 (two) times daily as needed. If blood pressure goes under 110      omeprazole 20 MG capsule   Commonly known as: PRILOSEC   Take 20 mg by mouth 2 (two) times daily.      SUPER OMEGA 3 EPA/DHA 1000 MG Caps   Take 1 capsule by mouth 2 (two) times daily.      traMADol 50 MG tablet   Commonly known as: ULTRAM   Take 50 mg by mouth every 6 (six) hours as needed. For pain           Follow-up Information    Follow up with SLATOSKY,Tobenna J., MD in 1 week.          The results of significant diagnostics from this hospitalization (including imaging, microbiology, ancillary and laboratory) are listed below for reference.    Significant Diagnostic Studies: Dg Chest 2 View  11/16/2011  *RADIOLOGY REPORT*  Clinical Data: Hypertension.  CHEST - 2 VIEW  Comparison: Previous examinations, the most recent dated 11/10/2011.  Findings:  Improved inspiration.  No gross change in mild enlargement of the cardiac silhouette, post CABG changes and left subclavian pacer leads.  Clear lungs.  Thoracic spine degenerative changes, including changes of DISH.  IMPRESSION: Stable mild cardiomegaly.  No acute abnormality.  Original Report Authenticated By: Darrol Angel, M.D.   Ct Head Wo Contrast  11/16/2011  *RADIOLOGY REPORT*  Clinical Data: Weakness for 2 days.  Confusion.  History of dementia.  CT HEAD WITHOUT CONTRAST  Technique:  Contiguous axial images were obtained from the base of the skull through the vertex without contrast.  Comparison: 09/10/2010  Findings: Bone windows demonstrate cerumen within the left external ear canal. Clear paranasal sinuses and mastoid air cells.  Soft tissue windows demonstrate expected cerebral atrophy. Moderate low density in the periventricular white matter likely related to small vessel disease.This may be slightly increased.  No  mass lesion, hemorrhage, hydrocephalus, acute infarct, intra- axial, or extra-axial fluid collection.  IMPRESSION:  1. No acute intracranial abnormality. 2. Cerebral atrophy and small vessel ischemic change.  Original Report Authenticated By: Consuello Bossier, M.D.   Dg Chest Port 1 View  11/10/2011  *RADIOLOGY REPORT*  Clinical Data: Shortness of breath, weakness  PORTABLE CHEST - 1 VIEW  Comparison: 07/25/2010  Findings: Cardiomediastinal silhouette is stable.  Mild hyperinflation again noted.  Status post CABG.  Dual lead cardiac pacemaker is unchanged in position.  No acute infiltrate or pulmonary edema.  IMPRESSION: No active disease.  Hyperinflation again noted.  Dual lead cardiac pacemaker is unchanged in position.  Original Report Authenticated By: Natasha Mead, M.D.    Microbiology: Recent Results (from the past 240 hour(s))  URINE CULTURE     Status: Normal (Preliminary result)   Collection Time   11/16/11 10:25 AM      Component Value Range Status Comment   Specimen  Description URINE, CLEAN CATCH   Final    Special Requests NONE   Final    Culture  Setup Time 11/16/2011 14:09   Final    Colony Count >=100,000 COLONIES/ML   Final    Culture     Final    Value: STAPHYLOCOCCUS SPECIES     Note: RIFAMPIN AND GENTAMICIN SHOULD NOT BE USED AS SINGLE DRUGS FOR TREATMENT OF STAPH INFECTIONS.   Report Status PENDING   Incomplete      Labs: Basic Metabolic Panel:  Lab 11/16/11 0981  NA 136  K 3.8  CL 103  CO2 25  GLUCOSE 104*  BUN 20  CREATININE 0.94  CALCIUM 9.0  MG --  PHOS --   Liver Function Tests:  Lab 11/16/11 1000  AST 15  ALT 12  ALKPHOS 60  BILITOT 0.4  PROT 6.5  ALBUMIN 2.8*   No results found for this basename: LIPASE:5,AMYLASE:5 in the last 168 hours No results found for this basename: AMMONIA:5 in the last 168 hours CBC:  Lab 11/16/11 1000  WBC 7.7  NEUTROABS 5.5  HGB 13.0  HCT 37.5*  MCV 92.8  PLT 266   Cardiac Enzymes:  Lab 11/16/11 1000  CKTOTAL 39  CKMB 1.8  CKMBINDEX --  TROPONINI <0.30   BNP: BNP (last 3 results)  Basename 11/16/11 1000  PROBNP 2374.0*   CBG: No results found for this basename: GLUCAP:5 in the last 168 hours  Time coordinating discharge: 40  Signed:  Ausencio Vaden A  [facility] 11/18/2011, 12:31 PM

## 2011-11-19 LAB — VITAMIN B12: Vitamin B-12: 491 pg/mL (ref 211–911)

## 2011-11-19 LAB — URINE CULTURE

## 2011-11-19 NOTE — Progress Notes (Signed)
Patient has [place] for Home health care as prior to admission. [name] with [name] called to resume HHC. B Erdem Naas RN,BSN, MHA

## 2012-04-05 IMAGING — CT CT HEAD W/O CM
3 of 5 series · 16 of 47 positions shown, 19 images · non-contrast
Comparison: Head CT 06/26/2010

CT HEAD

CLINICAL DATA: Fall, neck pain, dimension

CT HEAD WITHOUT CONTRAST
CT CERVICAL SPINE WITHOUT CONTRAST
TECHNIQUE: Multidetector CT imaging of the head and cervical spine
was performed following the standard protocol without intravenous
contrast.  Multiplanar CT image reconstructions of the cervical
spine were also generated.

[Series 604: axial reformats · axial · 0.42mm/px · z∈[-383,-209]mm · 10 of 122 slices shown, 13 images]
[im 10/122  brain]
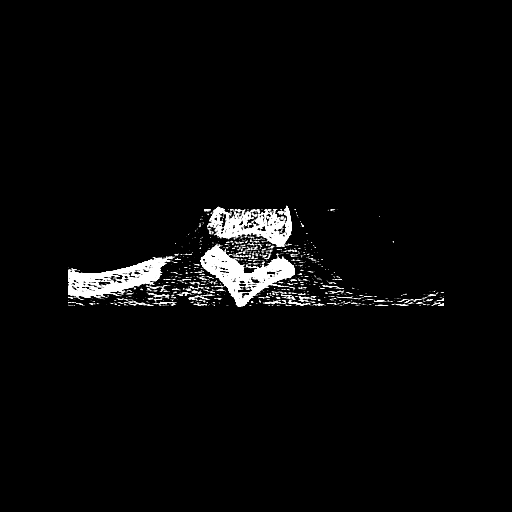
[im 10/122  bone]
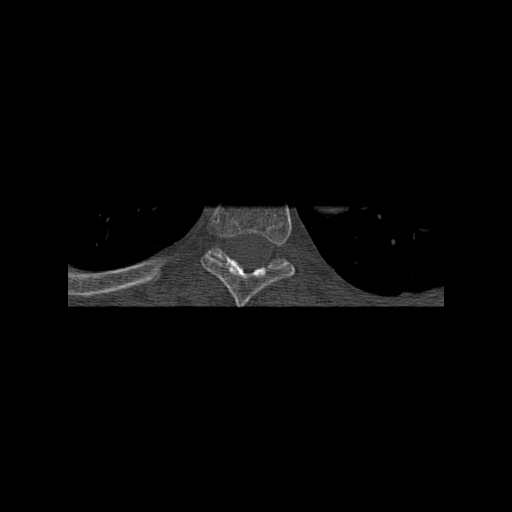
[im 19/122  brain]
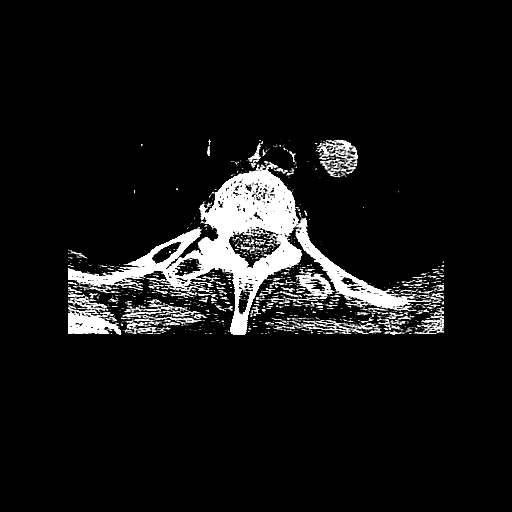
[im 38/122  brain]
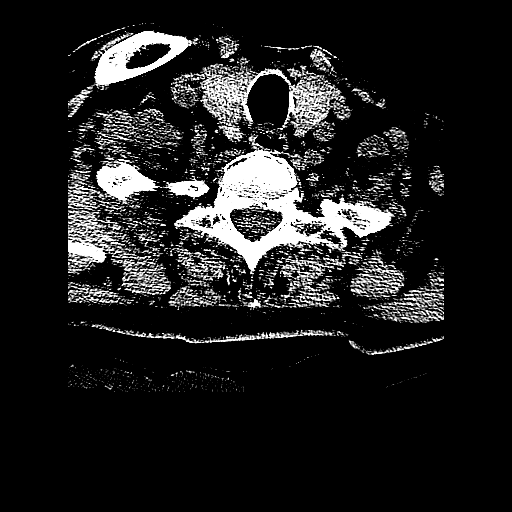
[im 47/122  brain]
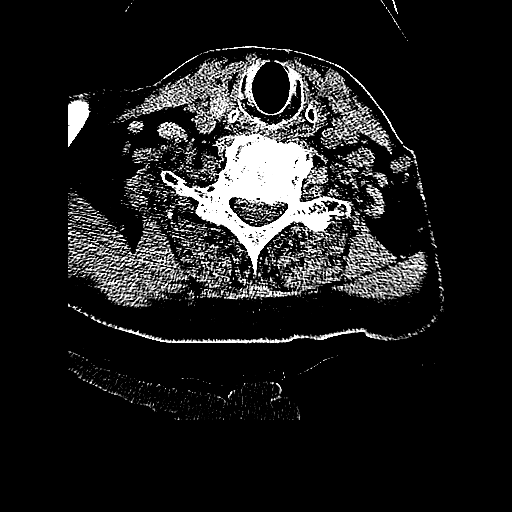
[im 56/122  brain]
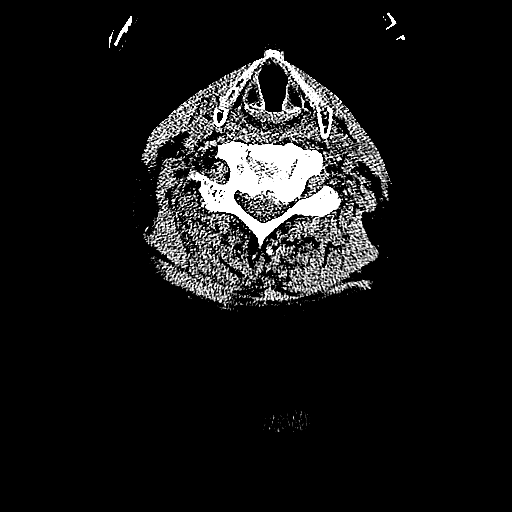
[im 56/122  bone]
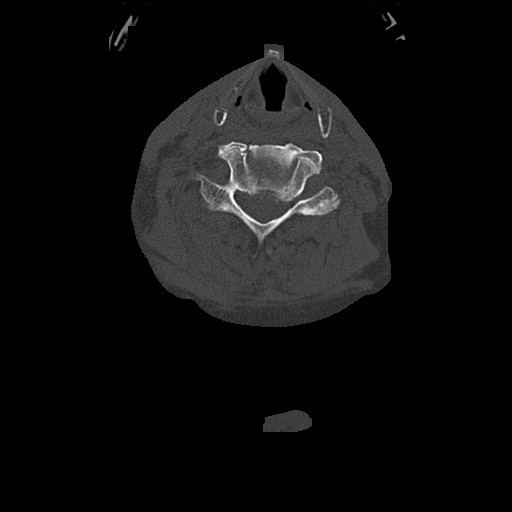
[im 66/122  brain]
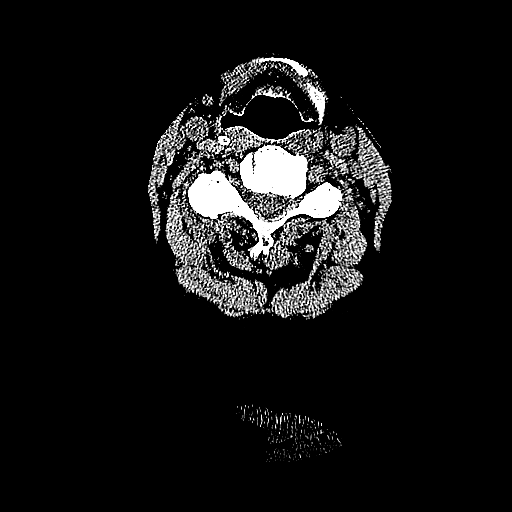
[im 75/122  brain]
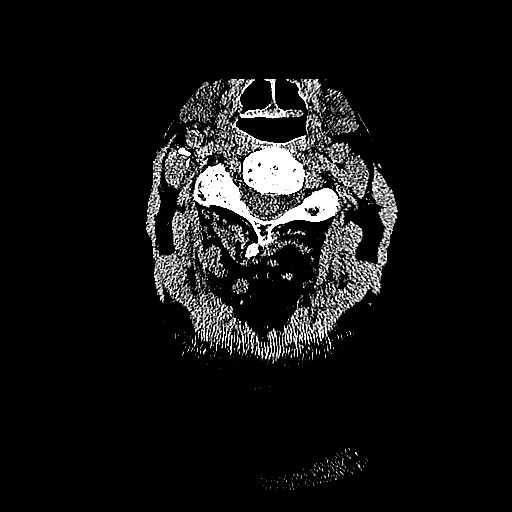
[im 94/122  brain]
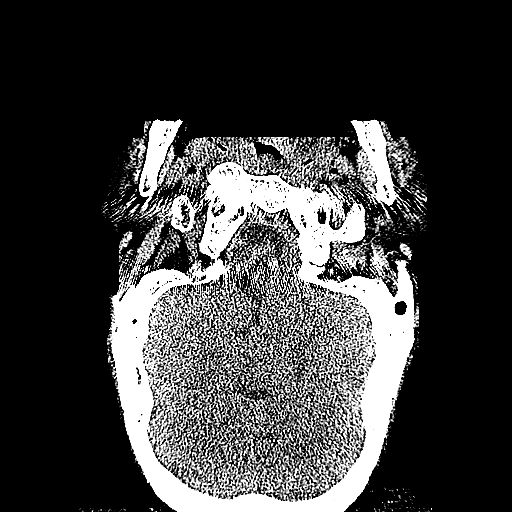
[im 103/122  brain]
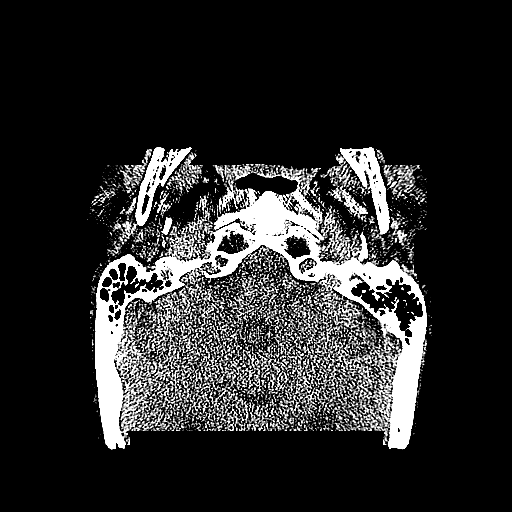
[im 103/122  bone]
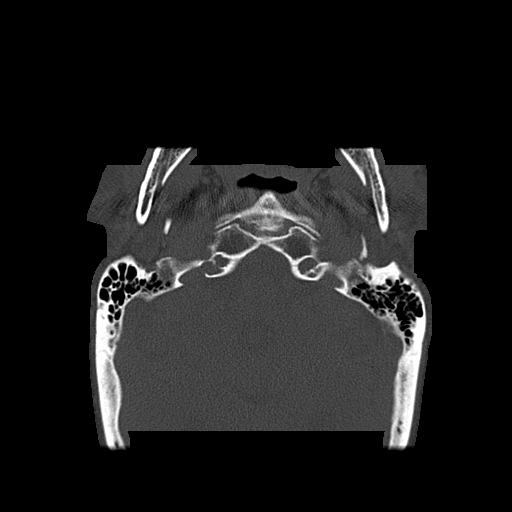
[im 112/122  brain]
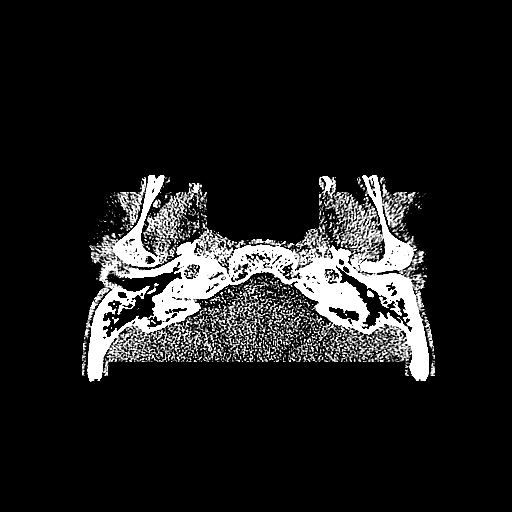

[Series 605: coronal images · coronal · 0.42mm/px · 3 of 52 slices shown]
[im 18/52  brain]
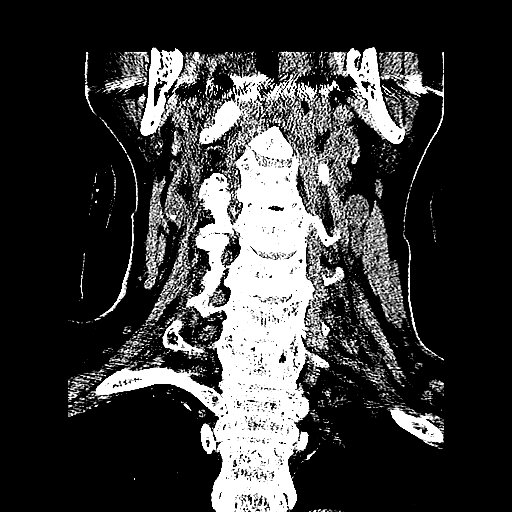
[im 23/52  brain]
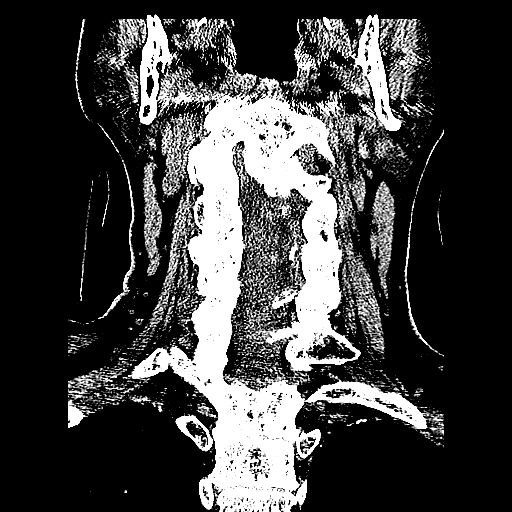
[im 29/52  brain]
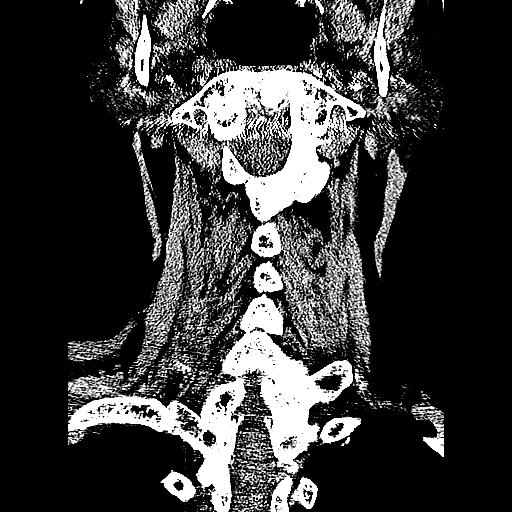

[Series 606: sagittal images · sagittal · 0.42mm/px · 3 of 52 slices shown]
[im 18/52  brain]
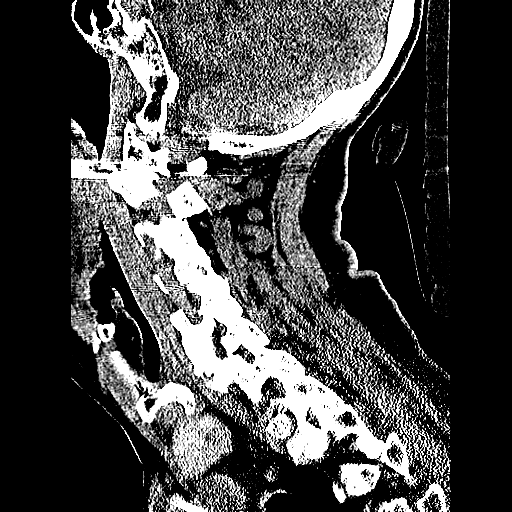
[im 26/52  brain]
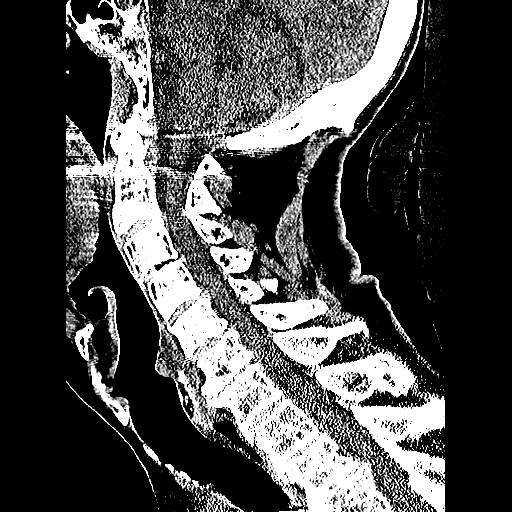
[im 35/52  brain]
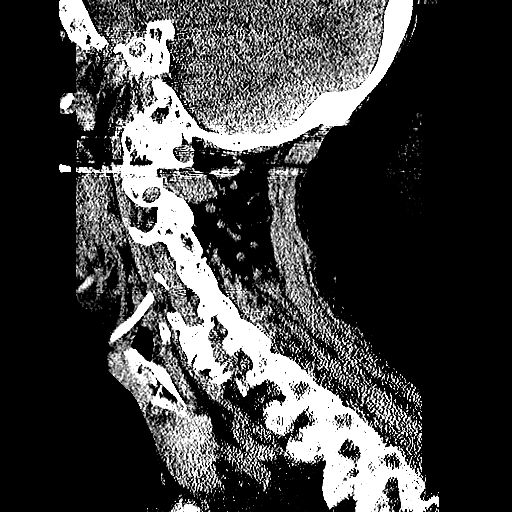

[16 of 47 positions shown; findings below may reference images not displayed]

FINDINGS: No acute intracranial hemorrhage.  No focal mass lesion.
No CT evidence of acute infarction.

There is generalized cortical atrophy and ventricular dilatation
similar to prior.  Periventricular and  subcortical white matter
hypodensities which are also unchanged.

No midline shift or mass effect.  Hydrocephalus

Paranasal sinuses and  mastoid air cells are clear.  Orbits are
normal.  Torus palantini noted.
IMPRESSION: 1.  No intracranial trauma.
2.  No change from prior.
3.  Atrophy and microvascular disease.

CT CERVICAL SPINE
FINDINGS: No prevertebral soft tissue swelling.  No loss of
vertebral body height.  There is loss of disc space height at C6-C7
with associated endplate osteophytosis.  Normal facet articulation.
Normal craniocervical junction.  There is chronic fragmentation and
the tip the spinous process of T1.

No evidence epidural paraspinal hematoma.
IMPRESSION: 1. No evidence of cervical spine fracture.
2.  Disc osteophytic disease and facet hypertrophy.

## 2012-06-29 ENCOUNTER — Other Ambulatory Visit (HOSPITAL_COMMUNITY): Payer: Self-pay | Admitting: Cardiovascular Disease

## 2012-06-29 DIAGNOSIS — I509 Heart failure, unspecified: Secondary | ICD-10-CM

## 2012-07-05 ENCOUNTER — Ambulatory Visit (HOSPITAL_COMMUNITY)
Admission: RE | Admit: 2012-07-05 | Discharge: 2012-07-05 | Disposition: A | Discharge status: Home / Self Care | Payer: Medicare Other | Source: Ambulatory Visit | Attending: Cardiovascular Disease | Admitting: Cardiovascular Disease

## 2012-07-05 DIAGNOSIS — Z951 Presence of aortocoronary bypass graft: Secondary | ICD-10-CM | POA: Insufficient documentation

## 2012-07-05 DIAGNOSIS — R0989 Other specified symptoms and signs involving the circulatory and respiratory systems: Secondary | ICD-10-CM | POA: Insufficient documentation

## 2012-07-05 DIAGNOSIS — I080 Rheumatic disorders of both mitral and aortic valves: Secondary | ICD-10-CM | POA: Insufficient documentation

## 2012-07-05 DIAGNOSIS — R0609 Other forms of dyspnea: Secondary | ICD-10-CM | POA: Insufficient documentation

## 2012-07-05 DIAGNOSIS — I079 Rheumatic tricuspid valve disease, unspecified: Secondary | ICD-10-CM | POA: Insufficient documentation

## 2012-07-05 DIAGNOSIS — I509 Heart failure, unspecified: Secondary | ICD-10-CM | POA: Insufficient documentation

## 2012-07-05 NOTE — Progress Notes (Signed)
[facility]   2D echo completed 07/05/2012.   Veda Canning, RDCS

## 2012-10-23 ENCOUNTER — Inpatient Hospital Stay (HOSPITAL_COMMUNITY)
Admission: EM | Admit: 2012-10-23 | Discharge: 2012-10-27 | DRG: 683 | Disposition: A | Discharge status: Skilled Nursing Facility | Payer: Medicare Other | Attending: Internal Medicine | Admitting: Internal Medicine

## 2012-10-23 ENCOUNTER — Emergency Department (HOSPITAL_COMMUNITY): Payer: Medicare Other

## 2012-10-23 ENCOUNTER — Encounter (HOSPITAL_COMMUNITY): Payer: Self-pay | Admitting: Emergency Medicine

## 2012-10-23 DIAGNOSIS — I447 Left bundle-branch block, unspecified: Secondary | ICD-10-CM | POA: Diagnosis present

## 2012-10-23 DIAGNOSIS — R531 Weakness: Secondary | ICD-10-CM | POA: Diagnosis present

## 2012-10-23 DIAGNOSIS — F02818 Dementia in other diseases classified elsewhere, unspecified severity, with other behavioral disturbance: Secondary | ICD-10-CM | POA: Diagnosis present

## 2012-10-23 DIAGNOSIS — G309 Alzheimer's disease, unspecified: Secondary | ICD-10-CM | POA: Diagnosis present

## 2012-10-23 DIAGNOSIS — E46 Unspecified protein-calorie malnutrition: Secondary | ICD-10-CM

## 2012-10-23 DIAGNOSIS — Z951 Presence of aortocoronary bypass graft: Secondary | ICD-10-CM

## 2012-10-23 DIAGNOSIS — IMO0002 Reserved for concepts with insufficient information to code with codable children: Secondary | ICD-10-CM

## 2012-10-23 DIAGNOSIS — F0281 Dementia in other diseases classified elsewhere with behavioral disturbance: Secondary | ICD-10-CM | POA: Diagnosis present

## 2012-10-23 DIAGNOSIS — R627 Adult failure to thrive: Secondary | ICD-10-CM | POA: Diagnosis present

## 2012-10-23 DIAGNOSIS — F0391 Unspecified dementia with behavioral disturbance: Secondary | ICD-10-CM | POA: Diagnosis present

## 2012-10-23 DIAGNOSIS — N179 Acute kidney failure, unspecified: Principal | ICD-10-CM | POA: Diagnosis present

## 2012-10-23 DIAGNOSIS — F03918 Unspecified dementia, unspecified severity, with other behavioral disturbance: Secondary | ICD-10-CM | POA: Diagnosis present

## 2012-10-23 DIAGNOSIS — E860 Dehydration: Secondary | ICD-10-CM

## 2012-10-23 DIAGNOSIS — I440 Atrioventricular block, first degree: Secondary | ICD-10-CM | POA: Diagnosis present

## 2012-10-23 DIAGNOSIS — I509 Heart failure, unspecified: Secondary | ICD-10-CM | POA: Diagnosis present

## 2012-10-23 DIAGNOSIS — E785 Hyperlipidemia, unspecified: Secondary | ICD-10-CM | POA: Diagnosis present

## 2012-10-23 DIAGNOSIS — Z96649 Presence of unspecified artificial hip joint: Secondary | ICD-10-CM

## 2012-10-23 DIAGNOSIS — I2510 Atherosclerotic heart disease of native coronary artery without angina pectoris: Secondary | ICD-10-CM | POA: Diagnosis present

## 2012-10-23 DIAGNOSIS — Z8546 Personal history of malignant neoplasm of prostate: Secondary | ICD-10-CM

## 2012-10-23 DIAGNOSIS — I10 Essential (primary) hypertension: Secondary | ICD-10-CM | POA: Diagnosis present

## 2012-10-23 DIAGNOSIS — I129 Hypertensive chronic kidney disease with stage 1 through stage 4 chronic kidney disease, or unspecified chronic kidney disease: Secondary | ICD-10-CM | POA: Diagnosis present

## 2012-10-23 DIAGNOSIS — F0390 Unspecified dementia without behavioral disturbance: Secondary | ICD-10-CM

## 2012-10-23 DIAGNOSIS — N189 Chronic kidney disease, unspecified: Secondary | ICD-10-CM

## 2012-10-23 DIAGNOSIS — N289 Disorder of kidney and ureter, unspecified: Secondary | ICD-10-CM

## 2012-10-23 DIAGNOSIS — C61 Malignant neoplasm of prostate: Secondary | ICD-10-CM | POA: Diagnosis present

## 2012-10-23 DIAGNOSIS — Z87891 Personal history of nicotine dependence: Secondary | ICD-10-CM

## 2012-10-23 DIAGNOSIS — Z8679 Personal history of other diseases of the circulatory system: Secondary | ICD-10-CM

## 2012-10-23 DIAGNOSIS — N390 Urinary tract infection, site not specified: Secondary | ICD-10-CM | POA: Diagnosis present

## 2012-10-23 DIAGNOSIS — F0280 Dementia in other diseases classified elsewhere without behavioral disturbance: Secondary | ICD-10-CM | POA: Diagnosis present

## 2012-10-23 DIAGNOSIS — Z79899 Other long term (current) drug therapy: Secondary | ICD-10-CM

## 2012-10-23 DIAGNOSIS — Z950 Presence of cardiac pacemaker: Secondary | ICD-10-CM

## 2012-10-23 LAB — COMPREHENSIVE METABOLIC PANEL
BUN: 30 mg/dL — ABNORMAL HIGH (ref 6–23)
CO2: 23 mEq/L (ref 19–32)
Chloride: 99 mEq/L (ref 96–112)
Creatinine, Ser: 1.79 mg/dL — ABNORMAL HIGH (ref 0.50–1.35)
GFR calc non Af Amer: 31 mL/min — ABNORMAL LOW (ref >90)
Total Bilirubin: 0.3 mg/dL (ref 0.3–1.2)

## 2012-10-23 LAB — CBC WITH DIFFERENTIAL/PLATELET
HCT: 37.6 % — ABNORMAL LOW (ref 39.0–52.0)
Hemoglobin: 12.6 g/dL — ABNORMAL LOW (ref 13.0–17.0)
Lymphocytes Relative: 12 % (ref 12–46)
Lymphs Abs: 1.5 10*3/uL (ref 0.7–4.0)
Monocytes Absolute: 1 10*3/uL (ref 0.1–1.0)
Monocytes Relative: 8 % (ref 3–12)
Neutro Abs: 9.5 10*3/uL — ABNORMAL HIGH (ref 1.7–7.7)
WBC: 12.1 10*3/uL — ABNORMAL HIGH (ref 4.0–10.5)

## 2012-10-23 LAB — POCT I-STAT TROPONIN I: Troponin i, poc: 0.06 ng/mL (ref 0.00–0.08)

## 2012-10-23 LAB — URINALYSIS, ROUTINE W REFLEX MICROSCOPIC
Bilirubin Urine: NEGATIVE
Glucose, UA: NEGATIVE mg/dL
Ketones, ur: NEGATIVE mg/dL
Nitrite: NEGATIVE
Protein, ur: NEGATIVE mg/dL

## 2012-10-23 LAB — CG4 I-STAT (LACTIC ACID): Lactic Acid, Venous: 0.8 mmol/L (ref 0.5–2.2)

## 2012-10-23 LAB — URINE MICROSCOPIC-ADD ON

## 2012-10-23 LAB — LIPASE, BLOOD: Lipase: 23 U/L (ref 11–59)

## 2012-10-23 MED ORDER — SODIUM CHLORIDE 0.9 % IV SOLN
INTRAVENOUS | Status: DC
  Administered 2012-10-23: 22:00:00 via INTRAVENOUS

## 2012-10-23 MED ORDER — SODIUM CHLORIDE 0.9 % IV BOLUS (SEPSIS)
2000.0000 mL | Freq: Once | INTRAVENOUS | Status: AC
  Administered 2012-10-23: 2000 mL via INTRAVENOUS

## 2012-10-23 MED ORDER — SODIUM CHLORIDE 0.9 % IV BOLUS (SEPSIS)
500.0000 mL | Freq: Once | INTRAVENOUS | Status: AC
  Administered 2012-10-23: 500 mL via INTRAVENOUS

## 2012-10-23 MED ORDER — CEFTRIAXONE SODIUM 1 G IJ SOLR
1.0000 g | Freq: Once | INTRAMUSCULAR | Status: AC
  Administered 2012-10-23: 1 g via INTRAVENOUS
  Filled 2012-10-23: qty 10

## 2012-10-23 MED ORDER — PANTOPRAZOLE SODIUM 40 MG IV SOLR
40.0000 mg | Freq: Once | INTRAVENOUS | Status: AC
  Administered 2012-10-23: 40 mg via INTRAVENOUS
  Filled 2012-10-23: qty 40

## 2012-10-23 NOTE — ED Provider Notes (Signed)
History     CSN: [id number]  Arrival date & time 10/23/12  1951   First MD Initiated Contact with Patient 10/23/12 2033      Chief Complaint  Patient presents with  . Urinary Tract Infection    (Consider location/radiation/quality/duration/timing/severity/associated sxs/prior treatment) HPI This 79 year old demented patient has a history of chronic generalized weakness chronic decreased appetite walks with a walker with assistance with a gait belt at baseline and over the last 2 weeks has been treated with doxycycline for presumptive urine infection since he has been weaker than usual for the last month with decreased appetite compared to usual for the last month darker urine than usual for the last month and today has had some intermittent suprapubic pelvic pain sitting to the ED for evaluation, there's been no fever no change in his baseline pleasant confusion no cough chest pain shortness of breath vomiting diarrhea bloody stools rashes or new focal weakness in treatment prior to arrival consisted doxycycline for the last 2 weeks initially prescribed by urology at a routine visit then by his family doctor apparently a urine was checked 2 weeks ago with unknown results and no urine was checked when his prescription was renewed. The patient has no current abdominal pain upon arrival to the ED. He is still able to walk with his walker today but is not walking as far as he usually can the can still walk in the house with assistance today. Past Medical History  Diagnosis Date  . Senile dementia, uncomplicated   . Congestive heart failure, unspecified   . Closed fracture of surgical neck of humerus   . Coronary atherosclerosis of native coronary artery   . Malignant neoplasm of prostate   . Dementia, unspecified, without behavioral disturbance   . Anemia, unspecified   . Other and unspecified hyperlipidemia   . Abnormality of secretion of gastrin   . Femur fracture, right   . Pacemaker   .  Zollinger-Ellison syndrome     History of  . Hypertension   . Alzheimer's dementia 2007  . Nephrolithiasis     Past Surgical History  Procedure Laterality Date  . Cataract extraction, bilateral    . Hemorrhoid surgery    . Bilateral inguinal hernia repair    . Tonsillectomy    . Hernia repair    . Joint replacement      Left total hip  . Coronary artery bypass graft  1999    x 3 vessels  . Eye surgery    . Cryoblation of prostate  09/2004  . Cardiac catheterization  06/2006  . Coronary angioplasty  1991  . Insert / replace / remove pacemaker  01/2007  . Orif right femoral neck fracture  07/25/2010    Family History  Problem Relation Age of Onset  . Heart failure Mother   . Stroke Father   . Stroke Brother   . Stroke Brother   . Heart failure Sister     History  Substance Use Topics  . Smoking status: Former Smoker    Types: Cigarettes  . Smokeless tobacco: Never Used  . Alcohol Use: No      Review of Systems 10 Systems reviewed and are negative for acute change except as noted in the HPI. Allergies  Ciprofloxacin  Home Medications   No current outpatient prescriptions on file.  BP 159/77  Pulse 104  Temp(Src) 98.2 F (36.8 C) (Oral)  Resp 16  Ht 5\' 11"  (1.803 m)  Wt 147 lb 11.3  oz (67 kg)  BMI 20.61 kg/m2  SpO2 95%  Physical Exam  Nursing note and vitals reviewed. Constitutional:  Awake, alert, nontoxic appearance.  HENT:  Head: Atraumatic.  Eyes: Right eye exhibits no discharge. Left eye exhibits no discharge.  Neck: Neck supple.  Cardiovascular: Normal rate.   No murmur heard. Irregularly irregular rhythm  Pulmonary/Chest: Effort normal and breath sounds normal. No respiratory distress. He has no wheezes. He has no rales. He exhibits no tenderness.  Abdominal: Soft. Bowel sounds are normal. He exhibits no distension and no mass. There is no tenderness. There is no rebound and no guarding.  Genitourinary:  Patient has a penile pump in the  scrotum the scrotum is nontender he has no palpable inguinal hernias  Musculoskeletal: He exhibits no edema and no tenderness.  Baseline ROM, no obvious new focal weakness.  Neurological: He is alert.  Mental status and motor strength appears baseline for patient and situation.  Skin: No rash noted.  Psychiatric: He has a normal mood and affect.    ED Course  Procedures (including critical care time) ECG: Paced rhythm with occasional PVCs ventricular rate 82, no significant change compared with [id number]  Patient / Family / Caregiver understand and agree with initial ED impression and plan with expectations set for ED visit.  Pt stable in ED with no significant deterioration in condition.Patient / Family / Caregiver informed of clinical course, understand medical decision-making process, and agree with plan.  Family requests at least Obs since Pt not taking po today and is too weak for home tonight. Pt with persistent/recurrent UTI despite two weeks of po antibiotics, [facility]. 2320 Labs Reviewed  CBC WITH DIFFERENTIAL - Abnormal; Notable for the following:    WBC 12.1 (*)    RBC 4.06 (*)    Hemoglobin 12.6 (*)    HCT 37.6 (*)    Neutrophils Relative % 79 (*)    Neutro Abs 9.5 (*)    All other components within normal limits  COMPREHENSIVE METABOLIC PANEL - Abnormal; Notable for the following:    Sodium 133 (*)    Glucose, Bld 120 (*)    BUN 30 (*)    Creatinine, Ser 1.79 (*)    Albumin 2.8 (*)    GFR calc non Af Amer 31 (*)    GFR calc Af Amer 36 (*)    All other components within normal limits  URINALYSIS, ROUTINE W REFLEX MICROSCOPIC - Abnormal; Notable for the following:    Hgb urine dipstick SMALL (*)    Leukocytes, UA SMALL (*)    All other components within normal limits  URINE CULTURE  LIPASE, BLOOD  URINE MICROSCOPIC-ADD ON  BASIC METABOLIC PANEL  CG4 I-STAT (LACTIC ACID)  POCT I-STAT TROPONIN I   Dg Chest 2 View  10/23/2012   *RADIOLOGY REPORT*  Clinical  Data: Urinary tract infection.  Weakness.  CHEST - 2 VIEW  Comparison: Chest x-ray 11/16/2011.  Findings: Lung volumes are low.  No acute consolidative airspace disease.  Linear opacities in the left lower lobe mass compatible with subsegmental atelectasis.  No definite pleural effusions.  No evidence of pulmonary edema.  Heart size is borderline enlarged. The patient is rotated to the left on today's exam, resulting in distortion of the mediastinal contours and reduced diagnostic sensitivity and specificity for mediastinal pathology. Atherosclerosis in the thoracic aorta.  Postoperative changes of median sternotomy for CABG including LIMA.  Left-sided pacemaker device in place with lead tips projecting over the expected location  of the right atrium and right ventricular apex.  IMPRESSION: 1.  Low lung volumes with left lower lobe subsegmental atelectasis. 2.  Atherosclerosis. 3.  Postoperative changes and support apparatus, as above.   Original Report Authenticated By: Trudie Reed, M.D.     1. UTI (urinary tract infection)   2. Generalized weakness with failure to thrive   3. Dehydration   4. Renal insufficiency   5. Dementia, without behavioral disturbance       MDM  The patient appears reasonably stabilized for admission considering the current resources, flow, and capabilities available in the ED at this time, and I doubt any other [facility] requiring further screening and/or treatment in the ED prior to admission.        Hurman Horn, MD 10/24/12 [phone]

## 2012-10-23 NOTE — ED Notes (Signed)
Per EMS pt had a recent UTI and was given a weeks worth of antibiotics About a week ago he started having increased weakness, decreased oral intake, and dark urine  Today he started c/o groin pain  Due to his weakness he has been having difficulty getting around  Pt has hx of prostate cancer not undergoing any treatment at this time

## 2012-10-23 NOTE — ED Notes (Signed)
NWG:[id number]<ZH> Expected date:10/23/12<BR> Expected time: 7:44 PM<BR> Means of arrival:Ambulance<BR> Comments:<BR> 79 yo  UTI

## 2012-10-24 ENCOUNTER — Encounter (HOSPITAL_COMMUNITY): Payer: Self-pay

## 2012-10-24 DIAGNOSIS — F0390 Unspecified dementia without behavioral disturbance: Secondary | ICD-10-CM

## 2012-10-24 DIAGNOSIS — N390 Urinary tract infection, site not specified: Secondary | ICD-10-CM

## 2012-10-24 DIAGNOSIS — Z950 Presence of cardiac pacemaker: Secondary | ICD-10-CM

## 2012-10-24 DIAGNOSIS — Z8679 Personal history of other diseases of the circulatory system: Secondary | ICD-10-CM

## 2012-10-24 DIAGNOSIS — E46 Unspecified protein-calorie malnutrition: Secondary | ICD-10-CM

## 2012-10-24 DIAGNOSIS — R5383 Other fatigue: Secondary | ICD-10-CM

## 2012-10-24 DIAGNOSIS — N289 Disorder of kidney and ureter, unspecified: Secondary | ICD-10-CM

## 2012-10-24 DIAGNOSIS — F0391 Unspecified dementia with behavioral disturbance: Secondary | ICD-10-CM

## 2012-10-24 DIAGNOSIS — N179 Acute kidney failure, unspecified: Secondary | ICD-10-CM

## 2012-10-24 DIAGNOSIS — R5381 Other malaise: Secondary | ICD-10-CM

## 2012-10-24 DIAGNOSIS — N189 Chronic kidney disease, unspecified: Secondary | ICD-10-CM | POA: Diagnosis present

## 2012-10-24 DIAGNOSIS — I10 Essential (primary) hypertension: Secondary | ICD-10-CM

## 2012-10-24 LAB — URINE CULTURE: Colony Count: NO GROWTH

## 2012-10-24 LAB — BASIC METABOLIC PANEL
Calcium: 8.7 mg/dL (ref 8.4–10.5)
Chloride: 103 mEq/L (ref 96–112)
Creatinine, Ser: 1.73 mg/dL — ABNORMAL HIGH (ref 0.50–1.35)
GFR calc Af Amer: 38 mL/min — ABNORMAL LOW (ref >90)

## 2012-10-24 MED ORDER — ENOXAPARIN SODIUM 30 MG/0.3ML ~~LOC~~ SOLN
30.0000 mg | SUBCUTANEOUS | Status: DC
  Administered 2012-10-24 – 2012-10-27 (×4): 30 mg via SUBCUTANEOUS
  Filled 2012-10-24 (×4): qty 0.3

## 2012-10-24 MED ORDER — PANTOPRAZOLE SODIUM 40 MG PO TBEC
40.0000 mg | DELAYED_RELEASE_TABLET | Freq: Every day | ORAL | Status: DC
  Filled 2012-10-24 (×2): qty 1

## 2012-10-24 MED ORDER — SODIUM CHLORIDE 0.9 % IV SOLN
INTRAVENOUS | Status: DC
  Administered 2012-10-24 – 2012-10-26 (×3): via INTRAVENOUS

## 2012-10-24 MED ORDER — LORATADINE 10 MG PO TABS
10.0000 mg | ORAL_TABLET | Freq: Every morning | ORAL | Status: DC
  Administered 2012-10-24 – 2012-10-27 (×4): 10 mg via ORAL
  Filled 2012-10-24 (×4): qty 1

## 2012-10-24 MED ORDER — METOPROLOL TARTRATE 12.5 MG HALF TABLET
12.5000 mg | ORAL_TABLET | Freq: Every evening | ORAL | Status: DC
  Administered 2012-10-24 – 2012-10-26 (×3): 12.5 mg via ORAL
  Filled 2012-10-24 (×4): qty 1

## 2012-10-24 MED ORDER — MEMANTINE HCL 10 MG PO TABS
10.0000 mg | ORAL_TABLET | Freq: Every morning | ORAL | Status: DC
  Administered 2012-10-24 – 2012-10-27 (×4): 10 mg via ORAL
  Filled 2012-10-24 (×4): qty 1

## 2012-10-24 MED ORDER — LORAZEPAM 2 MG/ML IJ SOLN
0.2500 mg | INTRAMUSCULAR | Status: DC | PRN
  Administered 2012-10-24: 0.25 mg via INTRAVENOUS
  Filled 2012-10-24: qty 1

## 2012-10-24 MED ORDER — MIDODRINE HCL 5 MG PO TABS
5.0000 mg | ORAL_TABLET | Freq: Two times a day (BID) | ORAL | Status: DC
  Administered 2012-10-24 – 2012-10-27 (×7): 5 mg via ORAL
  Filled 2012-10-24 (×9): qty 1

## 2012-10-24 MED ORDER — ACETAMINOPHEN 325 MG PO TABS
650.0000 mg | ORAL_TABLET | Freq: Four times a day (QID) | ORAL | Status: DC | PRN
  Administered 2012-10-26 (×2): 650 mg via ORAL
  Filled 2012-10-24 (×2): qty 2
  Filled 2012-10-24: qty 1

## 2012-10-24 MED ORDER — ONDANSETRON HCL 4 MG/2ML IJ SOLN
4.0000 mg | Freq: Four times a day (QID) | INTRAMUSCULAR | Status: DC | PRN
  Administered 2012-10-24: 4 mg via INTRAVENOUS
  Filled 2012-10-24: qty 2

## 2012-10-24 MED ORDER — RIVASTIGMINE 13.3 MG/24HR TD PT24
13.3000 mg | MEDICATED_PATCH | Freq: Every day | TRANSDERMAL | Status: DC
  Administered 2012-10-26 – 2012-10-27 (×2): 13.3 mg via TRANSDERMAL
  Filled 2012-10-24: qty 1

## 2012-10-24 MED ORDER — CEFTRIAXONE SODIUM 1 G IJ SOLR
1.0000 g | INTRAMUSCULAR | Status: DC
  Administered 2012-10-24 – 2012-10-25 (×2): 1 g via INTRAVENOUS
  Filled 2012-10-24 (×2): qty 10

## 2012-10-24 MED ORDER — ASPIRIN EC 81 MG PO TBEC
81.0000 mg | DELAYED_RELEASE_TABLET | Freq: Every evening | ORAL | Status: DC
  Administered 2012-10-24 – 2012-10-26 (×3): 81 mg via ORAL
  Filled 2012-10-24 (×4): qty 1

## 2012-10-24 MED ORDER — ACETAMINOPHEN 650 MG RE SUPP: 650.0000 mg | Freq: Four times a day (QID) | RECTAL | Status: DC | PRN

## 2012-10-24 NOTE — H&P (Signed)
History and Physical  Douglas Tucker ZOX:[id number] DOB: 1918/08/15 DOA: 10/23/2012  Referring physician: Dr. Fonnie Jarvis PCP: [name], MD   Chief Complaint: Failure to thrive  HPI:  This 79 year old with past medical history most significant for advanced Alzheimer's dementia, malignant neoplasm of prostate and congestive heart failure who has a history of chronic generalized weakness, chronic decreased appetite,  walks with a walker with assistance with a gait belt at baseline and over the last 2 weeks has been treated with doxycycline for presumptive urine infection. Patient over the last 1 week has not eaten anything, has had decreased urinary output and also has not been drinking a lot of fluid. Patient has also demonstrated intermittent suprapubic pelvic pain over last one week.    ED evaluation, there's been no fever, no change in his baseline pleasant confusion, no cough, chest pain, shortness of breath, vomiting, diarrhea, bloody stools, rashes or new focal weakness. I  The patient has no current abdominal pain upon arrival to the ED. He is still able to walk with his walker today but is not walking as far as he usually can still walk in the house with assistance today. The family insisted that patient should be admitted for further evaluation.  15 point review of system is negative except for as noted in the history of present illness.   Past Medical History  Diagnosis Date  . Senile dementia, uncomplicated   . Congestive heart failure, unspecified   . Closed fracture of surgical neck of humerus   . Coronary atherosclerosis of native coronary artery   . Malignant neoplasm of prostate   . Dementia, unspecified, without behavioral disturbance   . Anemia, unspecified   . Other and unspecified hyperlipidemia   . Abnormality of secretion of gastrin   . Femur fracture, right   . Pacemaker   . Zollinger-Ellison syndrome     History of  . Hypertension   . Alzheimer's dementia  2007  . Nephrolithiasis     Past Surgical History  Procedure Laterality Date  . Cataract extraction, bilateral    . Hemorrhoid surgery    . Bilateral inguinal hernia repair    . Tonsillectomy    . Hernia repair    . Joint replacement      Left total hip  . Coronary artery bypass graft  1999    x 3 vessels  . Eye surgery    . Cryoblation of prostate  09/2004  . Cardiac catheterization  06/2006  . Coronary angioplasty  1991  . Insert / replace / remove pacemaker  01/2007  . Orif right femoral neck fracture  07/25/2010    Social History:  reports that he has quit smoking. His smoking use included Cigarettes. He smoked 0.00 packs per day. He has never used smokeless tobacco. He reports that he does not drink alcohol or use illicit drugs.  Allergies  Allergen Reactions  . Ciprofloxacin Other (See Comments)    Blood pressure rises    Family History  Problem Relation Age of Onset  . Heart failure Mother   . Stroke Father   . Stroke Brother   . Stroke Brother   . Heart failure Sister      Prior to Admission medications   Medication Sig Start Date End Date Taking? Authorizing Provider  acetaminophen (TYLENOL) 500 MG tablet Take 1,000 mg by mouth every 6 (six) hours as needed. For pain   Yes Historical Provider, MD  aspirin 81 MG tablet Take 81 mg by  mouth every evening.    Yes Historical Provider, MD  Calcium Carbonate-Vitamin D (CALTRATE 600+D) 600-400 MG-UNIT per tablet Take 1 tablet by mouth every morning.    Yes Historical Provider, MD  loratadine (CLARITIN) 10 MG tablet Take 10 mg by mouth every morning.    Yes Historical Provider, MD  memantine (NAMENDA) 10 MG tablet Take 10 mg by mouth every morning.    Yes Historical Provider, MD  metoprolol tartrate (LOPRESSOR) 25 MG tablet Take 12.5 mg by mouth every evening.    Yes Historical Provider, MD  midodrine (PROAMATINE) 5 MG tablet Take 5 mg by mouth 2 (two) times daily as needed. If blood pressure goes under 110   Yes  Historical Provider, MD  Multiple Vitamins-Minerals (ELDERTONIC PO) Take 15 mLs by mouth 2 (two) times daily.    Yes Historical Provider, MD  Omega-3 Fatty Acids (SUPER OMEGA 3 EPA/DHA) 1000 MG CAPS Take 1 capsule by mouth 2 (two) times daily.    Yes Historical Provider, MD  omeprazole (PRILOSEC) 20 MG capsule Take 20 mg by mouth 2 (two) times daily.   Yes Historical Provider, MD  Rivastigmine (EXELON) 13.3 MG/24HR PT24 Place 13.3 mg onto the skin daily after lunch.    Yes Historical Provider, MD   Physical Exam: Filed Vitals:   10/23/12 1956 10/23/12 1957  BP:  166/85  Pulse:  77  Temp:  97.9 F (36.6 C)  TempSrc:  Oral  Resp:  16  SpO2: 94% 96%   Physical Exam: General: Vital signs reviewed and noted. Well-developed, well-nourished, in no acute distress; alert, appropriate and cooperative throughout examination.  Head: Normocephalic, atraumatic.  Eyes: PERRL, EOMI, No signs of anemia or jaundince.  Nose: Mucous membranes moist, not inflammed, nonerythematous.  Throat: Oropharynx nonerythematous, no exudate appreciated.   Neck: No deformities, masses, or tenderness noted.Supple, No carotid Bruits, no JVD.  Lungs:  Normal respiratory effort. Clear to auscultation BL without crackles or wheezes.  Heart: RRR. S1 and S2 normal without gallop, murmur, or rubs.  Abdomen:  BS normoactive. Soft, Nondistended, slightly tender to palpation.  No masses or organomegaly.  Extremities: No pretibial edema.  Neurologic:  pleasantly confused and unable to participate in neurological exam   Skin: No visible rashes, scars.     Wt Readings from Last 3 Encounters:  11/16/11 153 lb 14.4 oz (69.809 kg)  11/10/11 170 lb (77.111 kg)    Labs on Admission:  Basic Metabolic Panel:  Recent Labs Lab 10/23/12 2055  NA 133*  K 4.4  CL 99  CO2 23  GLUCOSE 120*  BUN 30*  CREATININE 1.79*  CALCIUM 9.4    Liver Function Tests:  Recent Labs Lab 10/23/12 2055  AST 16  ALT 9  ALKPHOS 75   BILITOT 0.3  PROT 6.5  ALBUMIN 2.8*    Recent Labs Lab 10/23/12 2055  LIPASE 23    CBC:  Recent Labs Lab 10/23/12 2055  WBC 12.1*  NEUTROABS 9.5*  HGB 12.6*  HCT 37.6*  MCV 92.6  PLT 216    Troponin (Point of Care Test)  Recent Labs  10/23/12 2101  TROPIPOC 0.06    BNP (last 3 results)  Recent Labs  11/16/11 1000  PROBNP 2374.0*    Radiological Exams on Admission: Dg Chest 2 View  10/23/2012   *RADIOLOGY REPORT*  Clinical Data: Urinary tract infection.  Weakness.  CHEST - 2 VIEW  Comparison: Chest x-ray 11/16/2011.  Findings: Lung volumes are low.  No acute consolidative airspace disease.  Linear opacities in the left lower lobe mass compatible with subsegmental atelectasis.  No definite pleural effusions.  No evidence of pulmonary edema.  Heart size is borderline enlarged. The patient is rotated to the left on today's exam, resulting in distortion of the mediastinal contours and reduced diagnostic sensitivity and specificity for mediastinal pathology. Atherosclerosis in the thoracic aorta.  Postoperative changes of median sternotomy for CABG including LIMA.  Left-sided pacemaker device in place with lead tips projecting over the expected location of the right atrium and right ventricular apex.  IMPRESSION: 1.  Low lung volumes with left lower lobe subsegmental atelectasis. 2.  Atherosclerosis. 3.  Postoperative changes and support apparatus, as above.   Original Report Authenticated By: Trudie Reed, M.D.    EKG: Independently reviewed. 82 beats per minute, T-wave difficult to find, left axis deviation, first degree AV block, complete left bundle branch block, elevated QTc No acute changes as compared to previous EKG.   Principal Problem:   Generalized weakness with failure to thrive Active Problems:   Acute on chronic renal failure   Dementia with behavioral disturbance   HTN (hypertension)   UTI (lower urinary tract  infection)   Assessment/Plan Patient is a 79 year old man with advanced dementia and currently living with his daughter who is trying to take care of him. Patient has severe decline over last 2 weeks where he has not been eating or drinking anything. Patient is minimally interactive and also has not been mobile at home. The family was concerned about her recurrent urinary tract infection and hence brought him in. Patient has incontinence and uses diapers which may be one of the reasons for his ongoing chronic urinary tract infections.   The bigger issue at this time seems to be his failure to thrive. Family is insistent that they would want to take care of him and also patient is full code at this time.   I will treat the patient for urinary tract infection with rocephin and await urine cultures and analysis.  -Consult PT OT for evaluation of placement of skilled nursing facility -IV fluids overnight at 100 cc an hour for 12 hours and repeat basic metabolic panel in the morning  Continue home medications.  Code Status: Full code Family Communication: Updated at bedside Disposition Plan/Anticipated LOS: Guarded  Time spent: 50 minutes  Douglas Mage, MD  [facility] Team 5  If 7PM-7AM, please contact night-coverage at [email or web address], password [facility] 10/24/2012, 12:15 AM

## 2012-10-24 NOTE — Progress Notes (Signed)
Patient admitted early this AM by Dr. Eben Burow.  Await urine culture, nutrition consult, and PT/OT eval.   In as a observation- see if meets admission criteria Marlin Canary DO

## 2012-10-24 NOTE — Evaluation (Signed)
Physical Therapy Evaluation Patient Details Name: Douglas Tucker MRN: [id number] DOB: [birth year] Today's Date: 10/24/2012 Time: 2952-8413 PT Time Calculation (min): 18 min  PT Assessment / Plan / Recommendation Clinical Impression  79 y.o. male with h/o advanced Alzheimer's admitted with generalized weakness, FTT x 2 weeks. Min assist for bed mobility, transfers, and to walk 15'. Pt would benefit from acute PT to maximize safety and independence with mobility.     PT Assessment  Patient needs continued PT services    Follow Up Recommendations  Home health PT    Does the patient have the potential to tolerate intense rehabilitation      Barriers to Discharge None      Equipment Recommendations  None recommended by PT    Recommendations for Other Services OT consult   Frequency Min 3X/week    Precautions / Restrictions Precautions Precautions: Fall Restrictions Weight Bearing Restrictions: No   Pertinent Vitals/Pain *pt denied pain**      Mobility  Bed Mobility Bed Mobility: Supine to Sit;Sit to Supine Supine to Sit: 4: Min assist Sit to Supine: 4: Min assist Details for Bed Mobility Assistance: cues to initiate Transfers Sit to Stand: 4: Min assist Stand to Sit: 4: Min guard Details for Transfer Assistance: cues for hand placement Ambulation/Gait Ambulation/Gait Assistance: 4: Min assist Ambulation Distance (Feet): 15 Feet Assistive device: Rolling walker Gait Pattern: Step-to pattern;Decreased step length - right;Decreased step length - left;Trunk flexed General Gait Details: cues to stand erect, and maintain safe proximity to RW when backing up to bed,  no LOB    Exercises     PT Diagnosis: Difficulty walking;Generalized weakness  PT Problem List: Decreased activity tolerance;Decreased mobility PT Treatment Interventions: Gait training;DME instruction;Functional mobility training;Patient/family education   PT Goals Acute Rehab PT Goals PT Goal  Formulation: Patient unable to participate in goal setting Time For Goal Achievement: 11/07/12 Potential to Achieve Goals: Fair Pt will go Supine/Side to Sit: with supervision PT Goal: Supine/Side to Sit - Progress: Goal set today Pt will go Sit to Stand: with supervision PT Goal: Sit to Stand - Progress: Goal set today Pt will go Stand to Sit: with supervision PT Goal: Stand to Sit - Progress: Goal set today Pt will Ambulate: 51 - 150 feet;with supervision;with rolling walker PT Goal: Ambulate - Progress: Goal set today  Visit Information  Last PT Received On: 10/24/12 Assistance Needed: +1 PT/OT Co-Evaluation/Treatment: Yes    Subjective Data  Subjective: I don't know where I am.  Patient Stated Goal: none stated   Prior Functioning  Home Living Lives With: Daughter (per chart) Additional Comments: home layout unknown Prior Function Comments: PLOF unknown.  Pt walked with walker and was incontinent per chart Communication Communication: HOH Dominant Hand: Right    Cognition  Cognition Arousal/Alertness: Awake/alert Behavior During Therapy: WFL for tasks assessed/performed Overall Cognitive Status: History of cognitive impairments - at baseline (oriented to self)    Extremity/Trunk Assessment Right Upper Extremity Assessment RUE ROM/Strength/Tone: [facility] for tasks assessed (strength grossly 4-/5 bil) Left Upper Extremity Assessment LUE ROM/Strength/Tone: WFL for tasks assessed Right Lower Extremity Assessment RLE ROM/Strength/Tone: WFL for tasks assessed RLE Sensation: WFL - Light Touch RLE Coordination: WFL - gross/fine motor Left Lower Extremity Assessment LLE ROM/Strength/Tone: WFL for tasks assessed LLE Sensation: WFL - Light Touch LLE Coordination: WFL - gross/fine motor Trunk Assessment Trunk Assessment: Kyphotic   Balance    End of Session PT - End of Session Equipment Utilized During Treatment: Gait belt Activity Tolerance:  Patient tolerated treatment  well Patient left: in bed;with call bell/phone within reach Nurse Communication: Mobility status  GP Functional Assessment Tool Used: clinical judgement Functional Limitation: Mobility: Walking and moving around Mobility: Walking and Moving Around Current Status (W0981): At least 20 percent but less than 40 percent impaired, limited or restricted Mobility: Walking and Moving Around Goal Status [id number]): At least 1 percent but less than 20 percent impaired, limited or restricted   Tamala Ser 10/24/2012, 10:03 AM  [phone]

## 2012-10-24 NOTE — Evaluation (Signed)
Occupational Therapy Evaluation Patient Details Name: Douglas Tucker MRN: [id number] DOB: 1918-07-23 Today's Date: 10/24/2012 Time: 4098-1191 OT Time Calculation (min): 19 min  OT Assessment / Plan / Recommendation Clinical Impression  This 79 year old man with a h/o advanced Alzheimers was admitted with generalized weakness with FTT. He has a 2 week h/o weakness/decline and has not been eating.  He has a UTI and is incontient.  Pt lives at home with family, but PLOF is iunknown.  He follows commands and will benefit from skilled OT to increase independence with adls with min A to supervision level goals in acute    OT Assessment  Patient needs continued OT Services    Follow Up Recommendations  SNF;Supervision/Assistance - 24 hour (depending on family's ability to care for pt)    Barriers to Discharge      Equipment Recommendations   (?3:1 if pt is aware of bowel needs/or timed toileting effect)    Recommendations for Other Services    Frequency  Min 2X/week    Precautions / Restrictions Precautions Precautions: Fall Restrictions Weight Bearing Restrictions: No   Pertinent Vitals/Pain No report of/signs or symptoms of pain    ADL  Grooming: Wash/dry face;Set up Where Assessed - Grooming: Supine, head of bed up Upper Body Bathing: Minimal assistance Where Assessed - Upper Body Bathing: Unsupported sitting Lower Body Bathing: Moderate assistance Where Assessed - Lower Body Bathing: Supported sit to stand Upper Body Dressing: Minimal assistance (lines) Where Assessed - Upper Body Dressing: Unsupported sitting Lower Body Dressing: Maximal assistance Where Assessed - Lower Body Dressing: Supported sit to stand Toilet Transfer: Minimal assistance Toilet Transfer Method: Sit to stand Toileting - Clothing Manipulation and Hygiene: Minimal assistance Where Assessed - Toileting Clothing Manipulation and Hygiene: Sit to stand from 3-in-1 or toilet Equipment Used: Gait  belt;Rolling walker Transfers/Ambulation Related to ADLs: ambulated to door with RW.   ADL Comments: Pt has bil UE intension tremor.  Unable to reach feet Pt consistently follows 1 step commands but is HOH   OT Diagnosis: Generalized weakness;Cognitive deficits  OT Problem List: Decreased strength;Decreased activity tolerance;Impaired balance (sitting and/or standing);Decreased cognition;Decreased safety awareness;Decreased knowledge of use of DME or AE OT Treatment Interventions: Self-care/ADL training;DME and/or AE instruction;Therapeutic activities;Cognitive remediation/compensation;Patient/family education;Balance training   OT Goals Acute Rehab OT Goals OT Goal Formulation: Patient unable to participate in goal setting Time For Goal Achievement: 11/07/12 Potential to Achieve Goals: Good ADL Goals Pt Will Perform Grooming: with min assist;Standing at sink (min guard) ADL Goal: Grooming - Progress: Goal set today Pt Will Transfer to Toilet: with supervision;Ambulation;3-in-1 (for BM/timed toileting) ADL Goal: Toilet Transfer - Progress: Goal set today Miscellaneous OT Goals Miscellaneous OT Goal #1: Pt will perform UB adls with supervision and set up from unsupported sitting OT Goal: Miscellaneous Goal #1 - Progress: Goal set today Miscellaneous OT Goal #2: Pt will perform LB adls with min A, supported sit to stand OT Goal: Miscellaneous Goal #2 - Progress: Goal set today  Visit Information  Last OT Received On: 10/24/12 Assistance Needed: +1 PT/OT Co-Evaluation/Treatment: Yes    Subjective Data  Subjective: I don't really want to get up Patient Stated Goal: unable to state.  Wanted back to bed after therapy   Prior Functioning     Home Living Lives With: Daughter (per chart) Additional Comments: home layout unknown Prior Function Comments: PLOF unknown.  Pt walked with walker and was incontinent per chart Communication Communication: HOH Dominant Hand: Right  Vision/Perception     Cognition  Cognition Arousal/Alertness: Awake/alert Behavior During Therapy: WFL for tasks assessed/performed Overall Cognitive Status: History of cognitive impairments - at baseline (oriented to self)    Extremity/Trunk Assessment Right Upper Extremity Assessment RUE ROM/Strength/Tone: [facility] for tasks assessed (strength grossly 4-/5 bil) Left Upper Extremity Assessment LUE ROM/Strength/Tone: WFL for tasks assessed Right Lower Extremity Assessment RLE ROM/Strength/Tone: [facility] for tasks assessed Trunk Assessment Trunk Assessment: Kyphotic     Mobility Bed Mobility Bed Mobility: Supine to Sit;Sit to Supine Supine to Sit: 4: Min assist Sit to Supine: 4: Min assist Details for Bed Mobility Assistance: cues to initiate Transfers Transfers: Sit to Stand;Stand to Sit Sit to Stand: 4: Min assist Stand to Sit: 4: Min guard Details for Transfer Assistance: cues for hand placement     Exercise     Balance     End of Session OT - End of Session Activity Tolerance: Patient limited by fatigue Patient left: in bed;with call bell/phone within reach  GO Functional Assessment Tool Used: dlinical observation/judgment Functional Limitation: Self care Self Care Current Status (G9562): At least 60 percent but less than 80 percent impaired, limited or restricted Self Care Goal Status (Z3086): At least 20 percent but less than 40 percent impaired, limited or restricted   [facility] 10/24/2012, 9:55 AM Marica Otter, OTR/L [phone] 10/24/2012

## 2012-10-25 DIAGNOSIS — F0390 Unspecified dementia without behavioral disturbance: Secondary | ICD-10-CM

## 2012-10-25 DIAGNOSIS — N179 Acute kidney failure, unspecified: Principal | ICD-10-CM

## 2012-10-25 DIAGNOSIS — N189 Chronic kidney disease, unspecified: Secondary | ICD-10-CM

## 2012-10-25 DIAGNOSIS — R5381 Other malaise: Secondary | ICD-10-CM

## 2012-10-25 LAB — CBC
Platelets: 215 10*3/uL (ref 150–400)
RDW: 13.8 % (ref 11.5–15.5)
WBC: 8 10*3/uL (ref 4.0–10.5)

## 2012-10-25 LAB — BASIC METABOLIC PANEL
Chloride: 106 mEq/L (ref 96–112)
GFR calc Af Amer: 42 mL/min — ABNORMAL LOW (ref >90)
Potassium: 3.8 mEq/L (ref 3.5–5.1)
Sodium: 138 mEq/L (ref 135–145)

## 2012-10-25 MED ORDER — OMEPRAZOLE 20 MG PO CPDR
20.0000 mg | DELAYED_RELEASE_CAPSULE | Freq: Two times a day (BID) | ORAL | Status: DC
  Administered 2012-10-25 – 2012-10-27 (×5): 20 mg via ORAL

## 2012-10-25 MED ORDER — ENSURE COMPLETE PO LIQD
237.0000 mL | Freq: Two times a day (BID) | ORAL | Status: DC
  Administered 2012-10-25 – 2012-10-27 (×3): 237 mL via ORAL

## 2012-10-25 NOTE — Progress Notes (Signed)
INITIAL NUTRITION ASSESSMENT  DOCUMENTATION CODES Per approved criteria  -Not Applicable   INTERVENTION: 1. Ensure Complete po BID, each supplement provides 350 kcal and 13 grams of protein.  2. Recommend considering appetite stimulant per son and wife's request.  NUTRITION DIAGNOSIS: Inadequate oral intake related to failure to thrive as evidenced by reported intake less than estimated needs.   Goal: Pt to meet >/= 90% of their estimated nutrition needs  Monitor:  Weight, FTT status, po intake  Reason for Assessment: Consult  79 y.o. male  Admitting Dx: Generalized weakness  ASSESSMENT: Pt with PMH of advanced Alzheimer's dementia, malignant neoplasm of prostate and CHF who has history of generalized weakness and chronic decreased appetite.   Per RN, pt ate a small amount for breakfast this morning. His son and wife were in the room and reported that he has not eaten any of his lunch and has not been able to be aroused since they have arrived. Son asked if there was a drug that pt could be given to increase his appetite. He says that his father does ok with chewing meats and swallowing. He also says that pt gets ensure at home, but is only able to drink half at a time. Will send ensure bid for pt.    Height: Ht Readings from Last 1 Encounters:  10/24/12 5\' 11"  (1.803 m)    Weight: Wt Readings from Last 1 Encounters:  10/24/12 147 lb 11.3 oz (67 kg)    Ideal Body Weight: 75.3 kg  % Ideal Body Weight: 89%  Wt Readings from Last 10 Encounters:  10/24/12 147 lb 11.3 oz (67 kg)  11/16/11 153 lb 14.4 oz (69.809 kg)  11/10/11 170 lb (77.111 kg)    Usual Body Weight: unknown  % Usual Body Weight: unknown  BMI:  Body mass index is 20.61 kg/(m^2).  Estimated Nutritional Needs: Kcal: 1700-1900 Protein: 90-100 g Fluid: 1.7-1.9 L  Skin: WNL  Diet Order: General  EDUCATION NEEDS: -No education needs identified at this time   Intake/Output Summary (Last 24  hours) at 10/25/12 1334 Last data filed at 10/25/12 1300  Gross per 24 hour  Intake    240 ml  Output   2600 ml  Net  -2360 ml    Last BM: none recorded   Labs:   Recent Labs Lab 10/23/12 2055 10/24/12 0529 10/25/12 0533  NA 133* 134* 138  K 4.4 3.8 3.8  CL 99 103 106  CO2 23 25 25   BUN 30* 26* 19  CREATININE 1.79* 1.73* 1.60*  CALCIUM 9.4 8.7 8.6  GLUCOSE 120* 113* 85    CBG (last 3)  No results found for this basename: GLUCAP,  in the last 72 hours  Scheduled Meds: . aspirin EC  81 mg Oral QPM  . cefTRIAXone (ROCEPHIN)  IV  1 g Intravenous Q24H  . enoxaparin (LOVENOX) injection  30 mg Subcutaneous Q24H  . loratadine  10 mg Oral q morning - 10a  . memantine  10 mg Oral q morning - 10a  . metoprolol tartrate  12.5 mg Oral QPM  . midodrine  5 mg Oral BID WC  . omeprazole  20 mg Oral BID  . Rivastigmine  13.3 mg Transdermal QPC lunch    Continuous Infusions: . sodium chloride 100 mL/hr at 10/25/12 2440    Past Medical History  Diagnosis Date  . Senile dementia, uncomplicated   . Congestive heart failure, unspecified   . Closed fracture of surgical neck of humerus   .  Coronary atherosclerosis of native coronary artery   . Malignant neoplasm of prostate   . Dementia, unspecified, without behavioral disturbance   . Anemia, unspecified   . Other and unspecified hyperlipidemia   . Abnormality of secretion of gastrin   . Femur fracture, right   . Pacemaker   . Zollinger-Ellison syndrome     History of  . Hypertension   . Alzheimer's dementia 2007  . Nephrolithiasis     Past Surgical History  Procedure Laterality Date  . Cataract extraction, bilateral    . Hemorrhoid surgery    . Bilateral inguinal hernia repair    . Tonsillectomy    . Hernia repair    . Joint replacement      Left total hip  . Coronary artery bypass graft  1999    x 3 vessels  . Eye surgery    . Cryoblation of prostate  09/2004  . Cardiac catheterization  06/2006  . Coronary  angioplasty  1991  . Insert / replace / remove pacemaker  01/2007  . Orif right femoral neck fracture  07/25/2010    Ebbie Latus RD, LDN

## 2012-10-25 NOTE — Progress Notes (Signed)
[facility] PROGRESS NOTE  Douglas Tucker ZOX:[id number] DOB: [birth year] DOA: 10/23/2012 PCP: [name], MD  Assessment/Plan: 1. Weakness- PT eval recommend home health- seems to have improved 2. UTI- treated as outpatient, NGTD 3. AKI on CKD- IVF- ? baseline  Code Status: full Family Communication: called son and wife- no answers Disposition Plan: home in AM   Consultants:    Procedures:    Antibiotics:    HPI/Subjective: Feeling well,feeding himself  Objective: Filed Vitals:   10/24/12 0518 10/24/12 1402 10/24/12 2232 10/25/12 0636  BP: 166/70 143/81 162/83 152/82  Pulse: 80 76 76 72  Temp: 97.8 F (36.6 C) 98.2 F (36.8 C) 98.3 F (36.8 C) 97.7 F (36.5 C)  TempSrc: Oral Oral Oral Axillary  Resp:  19 18 18   Height:      Weight:      SpO2: 97% 96% 97% 95%    Intake/Output Summary (Last 24 hours) at 10/25/12 0924 Last data filed at 10/25/12 8119  Gross per 24 hour  Intake      0 ml  Output   2100 ml  Net  -2100 ml   Filed Weights   10/24/12 0143  Weight: 67 kg (147 lb 11.3 oz)    Exam:   General:  Pleasant/cooperative  Cardiovascular: rrr  Respiratory: clear anterior  Abdomen: +BS, soft, NT  Musculoskeletal: moves all 4 ext   Data Reviewed: Basic Metabolic Panel:  Recent Labs Lab 10/23/12 2055 10/24/12 0529 10/25/12 0533  NA 133* 134* 138  K 4.4 3.8 3.8  CL 99 103 106  CO2 23 25 25   GLUCOSE 120* 113* 85  BUN 30* 26* 19  CREATININE 1.79* 1.73* 1.60*  CALCIUM 9.4 8.7 8.6   Liver Function Tests:  Recent Labs Lab 10/23/12 2055  AST 16  ALT 9  ALKPHOS 75  BILITOT 0.3  PROT 6.5  ALBUMIN 2.8*    Recent Labs Lab 10/23/12 2055  LIPASE 23   No results found for this basename: AMMONIA,  in the last 168 hours CBC:  Recent Labs Lab 10/23/12 2055 10/25/12 0533  WBC 12.1* 8.0  NEUTROABS 9.5*  --   HGB 12.6* 11.4*  HCT 37.6* 33.6*  MCV 92.6 93.9  PLT 216 215   Cardiac Enzymes: No results found for  this basename: CKTOTAL, CKMB, CKMBINDEX, TROPONINI,  in the last 168 hours BNP (last 3 results)  Recent Labs  11/16/11 1000  PROBNP 2374.0*   CBG: No results found for this basename: GLUCAP,  in the last 168 hours  Recent Results (from the past 240 hour(s))  URINE CULTURE     Status: None   Collection Time    10/23/12 10:20 PM      Result Value Range Status   Specimen Description URINE, CLEAN CATCH   Final   Special Requests NONE   Final   Culture  Setup Time 10/24/2012 01:46   Final   Colony Count NO GROWTH   Final   Culture NO GROWTH   Final   Report Status 10/24/2012 FINAL   Final     Studies: Dg Chest 2 View  10/23/2012   *RADIOLOGY REPORT*  Clinical Data: Urinary tract infection.  Weakness.  CHEST - 2 VIEW  Comparison: Chest x-ray 11/16/2011.  Findings: Lung volumes are low.  No acute consolidative airspace disease.  Linear opacities in the left lower lobe mass compatible with subsegmental atelectasis.  No definite pleural effusions.  No evidence of pulmonary edema.  Heart size is borderline enlarged.  The patient is rotated to the left on today's exam, resulting in distortion of the mediastinal contours and reduced diagnostic sensitivity and specificity for mediastinal pathology. Atherosclerosis in the thoracic aorta.  Postoperative changes of median sternotomy for CABG including LIMA.  Left-sided pacemaker device in place with lead tips projecting over the expected location of the right atrium and right ventricular apex.  IMPRESSION: 1.  Low lung volumes with left lower lobe subsegmental atelectasis. 2.  Atherosclerosis. 3.  Postoperative changes and support apparatus, as above.   Original Report Authenticated By: Trudie Reed, M.D.    Scheduled Meds: . aspirin EC  81 mg Oral QPM  . cefTRIAXone (ROCEPHIN)  IV  1 g Intravenous Q24H  . enoxaparin (LOVENOX) injection  30 mg Subcutaneous Q24H  . loratadine  10 mg Oral q morning - 10a  . memantine  10 mg Oral q morning - 10a  .  metoprolol tartrate  12.5 mg Oral QPM  . midodrine  5 mg Oral BID WC  . pantoprazole  40 mg Oral Daily  . Rivastigmine  13.3 mg Transdermal QPC lunch   Continuous Infusions: . sodium chloride 100 mL/hr at 10/25/12 6295    Principal Problem:   Generalized weakness with failure to thrive Active Problems:   Dementia with behavioral disturbance   HTN (hypertension)   UTI (lower urinary tract infection)   Acute on chronic renal failure    Time spent: 35    [facility], JESSICA  [facility] Pager [phone]. If 7PM-7AM, please contact night-coverage at [email or web address], password [facility] 10/25/2012, 9:24 AM  LOS: 2 days

## 2012-10-26 DIAGNOSIS — I10 Essential (primary) hypertension: Secondary | ICD-10-CM

## 2012-10-26 DIAGNOSIS — N289 Disorder of kidney and ureter, unspecified: Secondary | ICD-10-CM

## 2012-10-26 DIAGNOSIS — E46 Unspecified protein-calorie malnutrition: Secondary | ICD-10-CM

## 2012-10-26 LAB — PSA: PSA: 17.12 ng/mL — ABNORMAL HIGH (ref <=4.00)

## 2012-10-26 NOTE — Progress Notes (Signed)
Clinical Social Work Department BRIEF PSYCHOSOCIAL ASSESSMENT 10/26/2012  Patient:  Douglas Tucker, Douglas Tucker     Account Number:  [id number]     Admit date:  10/23/2012  Clinical Social Worker:  Dennison Bulla  Date/Time:  10/26/2012 03:00 PM  Referred by:  Physician  Date Referred:  10/26/2012 Referred for  SNF Placement   Other Referral:   Interview type:  Patient Other interview type:    PSYCHOSOCIAL DATA Living Status:  FAMILY Admitted from facility:   Level of care:   Primary support name:  Douglas Tucker Primary support relationship to patient:  SPOUSE Degree of support available:   Strong    CURRENT CONCERNS Current Concerns  Post-Acute Placement   Other Concerns:    SOCIAL WORK ASSESSMENT / PLAN CSW received referral to assist with DC planning. CSW reviewed chart and spoke with MD prior to meeting with patient. CSW met with patient and family at bedside.    CSW introduced myself and explained role. Patient lives with wife but is agreeable to SNF at DC. Patient prefers placement at [facility]. Patient has stayed at [facility] in the past. CSW explained process and explained Medicare benefits for SNF.    CSW completed FL2 and faxed out. CSW will follow up with bed offers.   Assessment/plan status:  Psychosocial Support/Ongoing Assessment of Needs Other assessment/ plan:   Information/referral to community resources:   SNF list    PATIENT'S/FAMILY'S RESPONSE TO PLAN OF CARE: Patient alert and oriented. Patient and family engaged in assessment and agreeable to placement.       (Coverage for Becky Sax)

## 2012-10-26 NOTE — Progress Notes (Addendum)
[facility] PROGRESS NOTE  Douglas Tucker ZOX:[id number] DOB: December 14, 1918 DOA: 10/23/2012 PCP: Douglas Tucker., MD  Assessment/Plan: 1. Weakness- will need SNF 2. UTI- treated as outpatient, NGTD 3. AKI on CKD- IVF- ? Baseline- BMP in AM 4. Prostate CA- order PSA for follow up urology appointment daughter to call  Code Status: full Family Communication: family at bedside Disposition Plan: SNf   Consultants:    Procedures:    Antibiotics:    HPI/Subjective: Feeling well,feeding himself Very weak while walking  Objective: Filed Vitals:   10/25/12 1429 10/25/12 2128 10/26/12 0500 10/26/12 0532  BP: 156/80 150/82 150/102 148/90  Pulse: 78 81 76   Temp: 97.6 F (36.4 C) 98.3 F (36.8 C) 98.3 F (36.8 C)   TempSrc: Axillary Oral Oral   Resp: 16 18 18    Height:      Weight:      SpO2: 97% 96% 96%     Intake/Output Summary (Last 24 hours) at 10/26/12 1157 Last data filed at 10/26/12 0939  Gross per 24 hour  Intake 1976.67 ml  Output   3000 ml  Net -1023.33 ml   Filed Weights   10/24/12 0143  Weight: 67 kg (147 lb 11.3 oz)    Exam:   General:  Pleasant/cooperative  Cardiovascular: rrr  Respiratory: clear anterior  Abdomen: +BS, soft, NT  Musculoskeletal: moves all 4 ext   Data Reviewed: Basic Metabolic Panel:  Recent Labs Lab 10/23/12 2055 10/24/12 0529 10/25/12 0533  NA 133* 134* 138  K 4.4 3.8 3.8  CL 99 103 106  CO2 23 25 25   GLUCOSE 120* 113* 85  BUN 30* 26* 19  CREATININE 1.79* 1.73* 1.60*  CALCIUM 9.4 8.7 8.6   Liver Function Tests:  Recent Labs Lab 10/23/12 2055  AST 16  ALT 9  ALKPHOS 75  BILITOT 0.3  PROT 6.5  ALBUMIN 2.8*    Recent Labs Lab 10/23/12 2055  LIPASE 23   No results found for this basename: AMMONIA,  in the last 168 hours CBC:  Recent Labs Lab 10/23/12 2055 10/25/12 0533  WBC 12.1* 8.0  NEUTROABS 9.5*  --   HGB 12.6* 11.4*  HCT 37.6* 33.6*  MCV 92.6 93.9  PLT 216 215   Cardiac  Enzymes: No results found for this basename: CKTOTAL, CKMB, CKMBINDEX, TROPONINI,  in the last 168 hours BNP (last 3 results)  Recent Labs  11/16/11 1000  PROBNP 2374.0*   CBG: No results found for this basename: GLUCAP,  in the last 168 hours  Recent Results (from the past 240 hour(s))  URINE CULTURE     Status: None   Collection Time    10/23/12 10:20 PM      Result Value Range Status   Specimen Description URINE, CLEAN CATCH   Final   Special Requests NONE   Final   Culture  Setup Time 10/24/2012 01:46   Final   Colony Count NO GROWTH   Final   Culture NO GROWTH   Final   Report Status 10/24/2012 FINAL   Final     Studies: No results found.  Scheduled Meds: . aspirin EC  81 mg Oral QPM  . enoxaparin (LOVENOX) injection  30 mg Subcutaneous Q24H  . feeding supplement  237 mL Oral BID BM  . loratadine  10 mg Oral q morning - 10a  . memantine  10 mg Oral q morning - 10a  . metoprolol tartrate  12.5 mg Oral QPM  . midodrine  5  mg Oral BID WC  . omeprazole  20 mg Oral BID  . Rivastigmine  13.3 mg Transdermal QPC lunch   Continuous Infusions:    Principal Problem:   Generalized weakness with failure to thrive Active Problems:   Dementia with behavioral disturbance   HTN (hypertension)   UTI (lower urinary tract infection)   Acute on chronic renal failure    Time spent: 35    [facility], Douglas Tucker  [facility] Pager [phone]. If 7PM-7AM, please contact night-coverage at [email or web address], password [facility] 10/26/2012, 11:57 AM  LOS: 3 days

## 2012-10-26 NOTE — Progress Notes (Signed)
Clinical Social Work Department CLINICAL SOCIAL WORK PLACEMENT NOTE 10/26/2012  Patient:  HRISTOPHER, MISSILDINE  Account Number:  [id number] Admit date:  10/23/2012  Clinical Social Worker:  Unk Lightning, LCSW  Date/time:  10/26/2012 03:00 PM  Clinical Social Work is seeking post-discharge placement for this patient at the following level of care:   SKILLED NURSING   (*CSW will update this form in Epic as items are completed)   10/26/2012  Patient/family provided with [facility] Department of Clinical Social Work's list of facilities offering this level of care within the geographic area requested by the patient (or if unable, by the patient's family).  10/26/2012  Patient/family informed of their freedom to choose among providers that offer the needed level of care, that participate in Medicare, Medicaid or managed care program needed by the patient, have an available bed and are willing to accept the patient.  10/26/2012  Patient/family informed of [facility]' ownership interest in [facility], as well as of the fact that they are under no obligation to receive care at this facility.  PASARR submitted to EDS on existing # PASARR number received from EDS on   FL2 transmitted to all facilities in geographic area requested by pt/family on  10/26/2012 FL2 transmitted to all facilities within larger geographic area on   Patient informed that his/her managed care company has contracts with or will negotiate with  certain facilities, including the following:     Patient/family informed of bed offers received:   Patient chooses bed at  Physician recommends and patient chooses bed at    Patient to be transferred to  on   Patient to be transferred to facility by   The following physician request were entered in Epic:   Additional Comments:

## 2012-10-26 NOTE — Progress Notes (Signed)
Physical Therapy Treatment Patient Details Name: Douglas Tucker MRN: [id number] DOB: Sep 20, 1918 Today's Date: 10/26/2012 Time: 1132-1205 PT Time Calculation (min): 33 min  PT Assessment / Plan / Recommendation Comments on Treatment Session  Pt asleep with untouched breakfast tray at bedside.  Difficult to keep aroused initially.  Pt c/o not feeling well.  Assisted pt OOB and noted incont of stools.  Assisted to [facility] and hygiene.  Pt fatigues quickly.  Amb short distance using EVA walker.  Significant mobilty decline since last visit.  MD observed partial activity and was asking aboutpt needing SNF.  Will discuss with LPT as initial evaluation reccommends HH.  Due to pt's mobility decline and today's session, pt may need SNF. MD stated she would discuss this family.    Follow Up Recommendations  SNF or 24/7 family assist + Home health PT      Does the patient have the potential to tolerate intense rehabilitation     Barriers to Discharge        Equipment Recommendations  None recommended by PT    Recommendations for Other Services    Frequency Min 3X/week   Plan      Precautions / Restrictions   falls  Pertinent Vitals/Pain No c/o pain    Mobility  Bed Mobility Bed Mobility: Supine to Sit Supine to Sit: 3: Mod assist Details for Bed Mobility Assistance: 75% VC's to increase self assist and increased time.  Very slow moving. Transfers Transfers: Sit to Stand;Stand to Sit Sit to Stand: 3: Mod assist;2: Max assist;From bed;From toilet Stand to Sit: 3: Mod assist;2: Max assist;To chair/3-in-1;To toilet Details for Transfer Assistance: 75% VC's on proper hand placement and demon initial posterior LOB.  Very slow moving.  HIGH FALL RISK. Ambulation/Gait Ambulation/Gait Assistance: 1: +2 Total assist Ambulation/Gait: Patient Percentage: 60% Ambulation Distance (Feet): 18 Feet Assistive device: Eva walker Ambulation/Gait Assistance Details: Pt appears weaker so used EVA walker  for amb and 2nd assist following with chair.  Limited activity tolerance and generalized weakness.  Gait Pattern: Step-to pattern;Decreased step length - right;Decreased step length - left;Trunk flexed Gait velocity: significant mobility decline since last visit     PT Goals                                                   Progressing slowly    Visit Information  Last PT Received On: 10/26/12 Assistance Needed: +2    Subjective Data      Cognition    impaired   Balance   poor  End of Session PT - End of Session Equipment Utilized During Treatment: Gait belt Activity Tolerance: Patient tolerated treatment well Patient left: in chair;with call bell/phone within reach Nurse Communication: Mobility status   Felecia Shelling  PTA [facility]  Acute  Rehab Pager      [phone]

## 2012-10-27 DIAGNOSIS — Z8679 Personal history of other diseases of the circulatory system: Secondary | ICD-10-CM

## 2012-10-27 DIAGNOSIS — Z950 Presence of cardiac pacemaker: Secondary | ICD-10-CM

## 2012-10-27 DIAGNOSIS — F0391 Unspecified dementia with behavioral disturbance: Secondary | ICD-10-CM

## 2012-10-27 LAB — CBC
HCT: 36 % — ABNORMAL LOW (ref 39.0–52.0)
Hemoglobin: 12 g/dL — ABNORMAL LOW (ref 13.0–17.0)
MCH: 30.8 pg (ref 26.0–34.0)
MCHC: 33.3 g/dL (ref 30.0–36.0)
RBC: 3.89 MIL/uL — ABNORMAL LOW (ref 4.22–5.81)

## 2012-10-27 LAB — BASIC METABOLIC PANEL
BUN: 16 mg/dL (ref 6–23)
CO2: 27 mEq/L (ref 19–32)
Calcium: 8.9 mg/dL (ref 8.4–10.5)
GFR calc non Af Amer: 47 mL/min — ABNORMAL LOW (ref >90)
Glucose, Bld: 98 mg/dL (ref 70–99)

## 2012-10-27 MED ORDER — ENSURE COMPLETE PO LIQD: 237.0000 mL | Freq: Two times a day (BID) | ORAL | Status: AC

## 2012-10-27 NOTE — Discharge Summary (Signed)
Physician Discharge Summary  Douglas Tucker FAO:[id number] DOB: 1919/03/08 DOA: 10/23/2012  PCP: [name], MD  Admit date: 10/23/2012 Discharge date: 10/27/2012  Time spent: >  Recommendations for Outpatient Follow-up:  1.SNF M.D. in one to 2 days 2. Urologist- Daughter to call [phone] upon discharge as directed for appointment  Discharge Diagnoses:  Principal Problem:   Generalized weakness with failure to thrive Active Problems:   Dementia with behavioral disturbance   HTN (hypertension)   UTI (lower urinary tract infection)   Acute on chronic renal failure   Discharge Condition: improved/stable  Diet recommendation: regular  Filed Weights   10/24/12 0143  Weight: 67 kg (147 lb 11.3 oz)    History of present illness:  This 79 year old with past medical history most significant for advanced Alzheimer's dementia, malignant neoplasm of prostate and congestive heart failure who has a history of chronic generalized weakness, chronic decreased appetite, walks with a walker with assistance with a gait belt at baseline and over the last 2 weeks has been treated with doxycycline for presumptive urine infection. Patient over the last 1 week has not eaten anything, has had decreased urinary output and also has not been drinking a lot of fluid. Patient has also demonstrated intermittent suprapubic pelvic pain over last one week.  ED evaluation, there's been no fever, no change in his baseline pleasant confusion, no cough, chest pain, shortness of breath, vomiting, diarrhea, bloody stools, rashes or new focal weakness. I  The patient has no current abdominal pain upon arrival to the ED. He is still able to walk with his walker today but is not walking as far as he usually can still walk in the house with assistance today. She was admitted for further evaluation.   Hospital Course:  1.UTI- patient had been treated with doxycycline outpatient, and urinalysis on admission  showed small leukocyte esterase with 11-20 WBCs. He was empirically and treated with Rocephin x4days in the hospital, and urine cultures done on admission show no growth to date, and so antibiotics were discontinued. He has remained afebrile hemodynamically stable with no leukocytosis. he'll not require any further antibiotics upon discharge 2.AKI on CKD- Cr was 1.79 on admission, normalized with hydration-1.29 today prior to discharge. 3.Prostate CA- patient had a PSA done in the hospital and it was elevated and 17.12, his last creatinine 11/18/11 was 11.18. He is to follow up with urology upon discharge-daughter was directed to call for appointment upon discharge. 4.Weakness- patient was evaluated by PT and skilled nursing recommended. He'll be discharged to nursing facility for rehabilitation at this time 5. Hypertension-patient to continue his outpatient medications upon discharge 6. Dementia-he was maintained on his outpatient medications wishes to continue upon discharge 7 history of CAD-he remained chest pain-free while in the hospital. He is to continue his outpatient medications upon discharge 8.S/p pacemaker in the past  Procedures:  none  Consultations:  none  Discharge Exam: Filed Vitals:   10/26/12 0532 10/26/12 1408 10/26/12 2154 10/27/12 0541  BP: 148/90 121/67 143/93 152/94  Pulse:  76 75 81  Temp:  97.8 F (36.6 C) 98.3 F (36.8 C)   TempSrc:  Oral Oral Oral  Resp:  16 18 18   Height:      Weight:      SpO2:  96% 96% 94%    Exam:  General: Pleasant/cooperative  Cardiovascular: RRR Respiratory: clear anterior  Abdomen: +BS, soft, NT/ND  Extremities: no cyanosis and no edema   Discharge Instructions  Discharge Orders  Future Orders Complete By Expires     Diet general  As directed     Increase activity slowly  As directed         Medication List    TAKE these medications       acetaminophen 500 MG tablet  Commonly known as:  TYLENOL  Take 1,000 mg  by mouth every 6 (six) hours as needed. For pain     aspirin 81 MG tablet  Take 81 mg by mouth every evening.     CALTRATE 600+D 600-400 MG-UNIT per tablet  Generic drug:  Calcium Carbonate-Vitamin D  Take 1 tablet by mouth every morning.     ELDERTONIC PO  Take 15 mLs by mouth 2 (two) times daily.     EXELON 13.3 MG/24HR Pt24  Generic drug:  Rivastigmine  Place 13.3 mg onto the skin daily after lunch.     feeding supplement Liqd  Take 237 mLs by mouth 2 (two) times daily between meals.     loratadine 10 MG tablet  Commonly known as:  CLARITIN  Take 10 mg by mouth every morning.     memantine 10 MG tablet  Commonly known as:  NAMENDA  Take 10 mg by mouth every morning.     metoprolol tartrate 25 MG tablet  Commonly known as:  LOPRESSOR  Take 12.5 mg by mouth every evening.     midodrine 5 MG tablet  Commonly known as:  PROAMATINE  Take 5 mg by mouth 2 (two) times daily as needed. If blood pressure goes under 110     omeprazole 20 MG capsule  Commonly known as:  PRILOSEC  Take 20 mg by mouth 2 (two) times daily.     SUPER OMEGA 3 EPA/DHA 1000 MG Caps  Take 1 capsule by mouth 2 (two) times daily.       Allergies  Allergen Reactions  . Ciprofloxacin Other (See Comments)    Blood pressure rises       Follow-up Information   Please follow up. (SNF MD in 1-2days)        The results of significant diagnostics from this hospitalization (including imaging, microbiology, ancillary and laboratory) are listed below for reference.    Significant Diagnostic Studies: Dg Chest 2 View  10/23/2012   *RADIOLOGY REPORT*  Clinical Data: Urinary tract infection.  Weakness.  CHEST - 2 VIEW  Comparison: Chest x-ray 11/16/2011.  Findings: Lung volumes are low.  No acute consolidative airspace disease.  Linear opacities in the left lower lobe mass compatible with subsegmental atelectasis.  No definite pleural effusions.  No evidence of pulmonary edema.  Heart size is borderline  enlarged. The patient is rotated to the left on today's exam, resulting in distortion of the mediastinal contours and reduced diagnostic sensitivity and specificity for mediastinal pathology. Atherosclerosis in the thoracic aorta.  Postoperative changes of median sternotomy for CABG including LIMA.  Left-sided pacemaker device in place with lead tips projecting over the expected location of the right atrium and right ventricular apex.  IMPRESSION: 1.  Low lung volumes with left lower lobe subsegmental atelectasis. 2.  Atherosclerosis. 3.  Postoperative changes and support apparatus, as above.   Original Report Authenticated By: Trudie Reed, M.D.    Microbiology: Recent Results (from the past 240 hour(s))  URINE CULTURE     Status: None   Collection Time    10/23/12 10:20 PM      Result Value Range Status   Specimen Description URINE, CLEAN CATCH  Final   Special Requests NONE   Final   Culture  Setup Time 10/24/2012 01:46   Final   Colony Count NO GROWTH   Final   Culture NO GROWTH   Final   Report Status 10/24/2012 FINAL   Final     Labs: Basic Metabolic Panel:  Recent Labs Lab 10/23/12 2055 10/24/12 0529 10/25/12 0533 10/27/12 0555  NA 133* 134* 138 136  K 4.4 3.8 3.8 3.8  CL 99 103 106 102  CO2 23 25 25 27   GLUCOSE 120* 113* 85 98  BUN 30* 26* 19 16  CREATININE 1.79* 1.73* 1.60* 1.29  CALCIUM 9.4 8.7 8.6 8.9   Liver Function Tests:  Recent Labs Lab 10/23/12 2055  AST 16  ALT 9  ALKPHOS 75  BILITOT 0.3  PROT 6.5  ALBUMIN 2.8*    Recent Labs Lab 10/23/12 2055  LIPASE 23   No results found for this basename: AMMONIA,  in the last 168 hours CBC:  Recent Labs Lab 10/23/12 2055 10/25/12 0533 10/27/12 0555  WBC 12.1* 8.0 8.0  NEUTROABS 9.5*  --   --   HGB 12.6* 11.4* 12.0*  HCT 37.6* 33.6* 36.0*  MCV 92.6 93.9 92.5  PLT 216 215 211   Cardiac Enzymes: No results found for this basename: CKTOTAL, CKMB, CKMBINDEX, TROPONINI,  in the last 168  hours BNP: BNP (last 3 results)  Recent Labs  11/16/11 1000  PROBNP 2374.0*   CBG: No results found for this basename: GLUCAP,  in the last 168 hours     Signed:  Rowena Moilanen C  [facility] 10/27/2012, 12:01 PM

## 2012-10-27 NOTE — Progress Notes (Signed)
Patient cleared for discharge. Packet copied and placed in [place]. Patient, spouse, and son at bedside aware and agreeable. ptar called for transport. Crystol Walpole C. Deago Burruss MSW, LCSW [phone]

## 2012-10-27 NOTE — Progress Notes (Signed)
CSW spoke with patient's spouse. Provided bed offers. She is agreeable to [facility]. Patient cleared for discharge today.  Linkoln Alkire C. Chirsty Armistead MSW, LCSW [phone]

## 2012-10-27 NOTE — Progress Notes (Signed)
Patient discharge to [facility]F [facility] with family at bedside, alert and disoriented X 2, discharge package given to Medical Transportation for delivery to Rehab, My Chart not access at this time, patient in stable condition at this time

## 2012-11-02 NOTE — Clinical Social Work Placement (Signed)
     Clinical Social Work Department CLINICAL SOCIAL WORK PLACEMENT NOTE 11/02/2012  Patient:  Douglas Tucker, Douglas Tucker  Account Number:  [id number] Admit date:  10/23/2012  Clinical Social Worker:  Unk Lightning, LCSW  Date/time:  10/26/2012 03:00 PM  Clinical Social Work is seeking post-discharge placement for this patient at the following level of care:   SKILLED NURSING   (*CSW will update this form in Epic as items are completed)   10/26/2012  Patient/family provided with [facility] Department of Clinical Social Works list of facilities offering this level of care within the geographic area requested by the patient (or if unable, by the patients family).  10/26/2012  Patient/family informed of their freedom to choose among providers that offer the needed level of care, that participate in Medicare, Medicaid or managed care program needed by the patient, have an available bed and are willing to accept the patient.  10/26/2012  Patient/family informed of [facility] ownership interest in [facility], as well as of the fact that they are under no obligation to receive care at this facility.  PASARR submitted to EDS on 10/27/2012 PASARR number received from EDS on 10/27/2012  FL2 transmitted to all facilities in geographic area requested by pt/family on  10/26/2012 FL2 transmitted to all facilities within larger geographic area on   Patient informed that his/her managed care company has contracts with or will negotiate with  certain facilities, including the following:     Patient/family informed of bed offers received:  10/27/2012 Patient chooses bed at [facility] Physician recommends and patient chooses bed at    Patient to be transferred to [facility] on  10/27/2012 Patient to be transferred to facility by ptar  The following physician request were entered in Epic:   Additional Comments:

## 2013-01-02 LAB — PACEMAKER DEVICE OBSERVATION

## 2013-01-31 ENCOUNTER — Encounter: Payer: Self-pay | Admitting: Cardiovascular Disease

## 2013-02-01 ENCOUNTER — Encounter: Payer: Self-pay | Admitting: Cardiovascular Disease

## 2013-03-14 ENCOUNTER — Telehealth: Payer: Self-pay | Admitting: Internal Medicine

## 2013-03-14 NOTE — Telephone Encounter (Signed)
03-14-13 LM FOR DTR TO CALL TO SET UP DEVICE FU APPT WITH [facility] FOR[facility]AU[facility] PT/MT

## 2013-05-08 ENCOUNTER — Ambulatory Visit: Payer: Medicare Other | Admitting: Cardiovascular Disease

## 2013-06-05 ENCOUNTER — Encounter: Payer: Self-pay | Admitting: *Deleted

## 2013-06-08 ENCOUNTER — Ambulatory Visit (INDEPENDENT_AMBULATORY_CARE_PROVIDER_SITE_OTHER): Payer: Medicare Other | Admitting: Cardiovascular Disease

## 2013-06-08 ENCOUNTER — Encounter: Payer: Self-pay | Admitting: Cardiovascular Disease

## 2013-06-08 VITALS — BP 100/70 | HR 77 | Ht 72.0 in

## 2013-06-08 DIAGNOSIS — Z8679 Personal history of other diseases of the circulatory system: Secondary | ICD-10-CM

## 2013-06-08 DIAGNOSIS — I959 Hypotension, unspecified: Secondary | ICD-10-CM

## 2013-06-08 DIAGNOSIS — I10 Essential (primary) hypertension: Secondary | ICD-10-CM

## 2013-06-08 DIAGNOSIS — Z950 Presence of cardiac pacemaker: Secondary | ICD-10-CM

## 2013-06-08 DIAGNOSIS — Z79899 Other long term (current) drug therapy: Secondary | ICD-10-CM

## 2013-06-08 DIAGNOSIS — I498 Other specified cardiac arrhythmias: Secondary | ICD-10-CM

## 2013-06-08 DIAGNOSIS — E782 Mixed hyperlipidemia: Secondary | ICD-10-CM

## 2013-06-08 DIAGNOSIS — I2510 Atherosclerotic heart disease of native coronary artery without angina pectoris: Secondary | ICD-10-CM

## 2013-06-08 DIAGNOSIS — I442 Atrioventricular block, complete: Secondary | ICD-10-CM

## 2013-06-08 LAB — MDC_IDC_ENUM_SESS_TYPE_INCLINIC
Battery Impedance: 2015 Ohm
Battery Remaining Longevity: 24 mo
Battery Voltage: 2.72 V
Brady Statistic AP VP Percent: 61 %
Brady Statistic AP VS Percent: 0 %
Brady Statistic AS VP Percent: 38 %
Brady Statistic AS VS Percent: 0 %
Date Time Interrogation Session: 20150130211221
Lead Channel Impedance Value: 490 Ohm
Lead Channel Impedance Value: 684 Ohm
Lead Channel Pacing Threshold Amplitude: 0.5 V
Lead Channel Pacing Threshold Amplitude: 0.75 V
Lead Channel Pacing Threshold Pulse Width: 0.4 ms
Lead Channel Pacing Threshold Pulse Width: 0.4 ms
Lead Channel Sensing Intrinsic Amplitude: 0.35 mV
Lead Channel Setting Pacing Amplitude: 2 V
Lead Channel Setting Pacing Amplitude: 2.5 V
Lead Channel Setting Pacing Pulse Width: 0.4 ms
Lead Channel Setting Sensing Sensitivity: 4 mV

## 2013-06-08 LAB — PACEMAKER DEVICE OBSERVATION

## 2013-06-08 NOTE — Progress Notes (Signed)
Patient ID: Douglas Douglas Tucker Douglas Douglas Tucker, male   DOB: 11/08/1918, 80 y.o.   MRN: [id number]      Reason for office visit CAD, complete heart block/permanent pacemaker, hypotension  Douglas Douglas Tucker Douglas Tucker is a long-standing patient of Dr. Terance Tucker and is here to establish new followup after his retirement. He is a 80 year old gentleman with moderately advanced dementia, complete heart block status post dual-chamber permanent pacemaker (Medtronic Adapta), coronary artery disease status post bypass surgery 1999 (LIMA to LAD, SVG to diagonal, SVG to RCA), Douglas Douglas Tucker Douglas Tucker syndrome, chronic hypotension requiring vasoconstrictor therapy, mildly depressed left ventricular (EF around 50%). He has not had problems with congestive heart failure. He has had frequent episodes of atrial tachycardia but no atrial fibrillation. Occasional episodes of nonsustained ventricular tachycardia have been recorded on his device.  He has been at [facility] for quite a while now and seems to be heading towards permanent residency there.  He is essentially wheelchair-bound. He has progressive Alzheimer's disease and failure to thrive. He is now severely underweight. He has long-standing cancer of the prostate that appears to be fairly indolent.   Allergies  Allergen Reactions  . Ativan [Lorazepam]   . Ciprofloxacin Other (See Comments)    Blood pressure rises    Current Outpatient Prescriptions  Medication Sig Dispense Refill  . acetaminophen (TYLENOL) 500 MG tablet Take 1,000 mg by mouth every 6 (six) hours as needed. For pain      . aspirin 81 MG tablet Take 81 mg by mouth every evening.       . Calcium Carbonate-Vitamin D (CALTRATE 600+D) 600-400 MG-UNIT per tablet Take 1 tablet by mouth every morning.       Marland Kitchen CARAFATE 1 GM/10ML suspension Take 2 g by mouth at bedtime.      . feeding supplement (ENSURE COMPLETE) LIQD Take 237 mLs by mouth 2 (two) times daily between meals.      Marland Kitchen loratadine (CLARITIN) 10 MG tablet Take  10 mg by mouth every morning.       . memantine (NAMENDA) 10 MG tablet Take 10 mg by mouth every morning.       . metoprolol tartrate (LOPRESSOR) 25 MG tablet Take 12.5 mg by mouth every evening.       . midodrine (PROAMATINE) 5 MG tablet Take 5 mg by mouth 2 (two) times daily as needed. If blood pressure goes under 110      . Multiple Vitamins-Minerals (ELDERTONIC PO) Take 15 mLs by mouth 2 (two) times daily.       Marland Kitchen NEXIUM 40 MG capsule Take 80 mg by mouth 2 (two) times daily.      . Omega-3 Fatty Acids (SUPER OMEGA 3 EPA/DHA) 1000 MG CAPS Take 1 capsule by mouth 2 (two) times daily.       Marland Kitchen omeprazole (PRILOSEC) 20 MG capsule Take 20 mg by mouth 2 (two) times daily.      . Rivastigmine (EXELON) 13.3 MG/24HR PT24 Place 13.3 mg onto the skin daily after lunch.        No current facility-administered medications for this visit.    Past Medical History  Diagnosis Date  . Senile dementia, uncomplicated   . Congestive heart failure, unspecified   . Closed fracture of surgical neck of humerus   . Coronary atherosclerosis of native coronary artery   . Malignant neoplasm of prostate   . Dementia, unspecified, without behavioral disturbance   . Anemia, unspecified   . Other and unspecified hyperlipidemia   .  Abnormality of secretion of gastrin   . Femur fracture, right   . Pacemaker 01/10/2007    Medtronic Adapta  . Zollinger-Ellison syndrome     History of  . Hypertension   . Alzheimer's dementia 2007  . Nephrolithiasis     Past Surgical History  Procedure Laterality Date  . Cataract extraction, bilateral    . Hemorrhoid surgery    . Bilateral inguinal hernia repair    . Tonsillectomy    . Hernia repair    . Joint replacement      Left total hip  . Coronary artery bypass graft  1999    x 3 vessels  . Eye surgery    . Cryoblation of prostate  09/2004  . Cardiac catheterization  06/2006  . Coronary angioplasty  1991  . Insert / replace / remove pacemaker  01/2007  . Orif right  femoral neck fracture  07/25/2010  . Nm myocar perf wall motion  11/07/07    small area of inferior scar vs diapragmatic attenuation with severe global hypokinesis c/w NISCM.    Family History  Problem Relation Age of Onset  . Heart failure Mother   . Stroke Father   . Stroke Brother   . Stroke Brother   . Heart failure Sister     History   Social History  . Marital Status: Married    Spouse Name: Douglas Douglas Tucker Douglas Tucker    Number of Children: 2  . Years of Education: N/A   Occupational History  . Retired, IT consultant    Social History Main Topics  . Smoking status: Former Smoker    Types: Cigarettes  . Smokeless tobacco: Never Used  . Alcohol Use: No  . Drug Use: No  . Sexual Activity: No   Other Topics Concern  . Not on file   Social History Narrative   Married.  Lives with wife and has a 24 hour caregiver.  Normally able to ambulate with a walker.    Review of systems: Obtained mostly from his family. He is not very communicative although he appears to understand most of the questions and smiles and says yes and no. The patient specifically denies any chest pain at rest exertion, dyspnea at rest or with exertion, orthopnea, paroxysmal nocturnal dyspnea, syncope, palpitations, focal neurological deficits, intermittent claudication, lower extremity edema, unexplained weight gain, cough, hemoptysis or wheezing.  PHYSICAL EXAM BP 100/70  Pulse 77  Ht 6' (1.829 m)  General: Alert, oriented to person and situation, no distress Head: no evidence of trauma, PERRL, EOMI, no exophtalmos or lid lag, no myxedema, no xanthelasma; normal ears, nose and oropharynx Neck: normal jugular venous pulsations and no hepatojugular reflux; brisk carotid pulses without delay and no carotid bruits Chest: clear to auscultation, no signs of consolidation by percussion or palpation, normal fremitus, symmetrical and full respiratory excursions, left subclavian pacemaker site appears healthy although  the overlying skin is quite thin. Cardiovascular: normal position and quality of the apical impulse, regular rhythm, normal first and paradoxically split second heart sounds, no rubs or gallops, 2/6 systolic murmur, possibly tricuspid insufficiency Abdomen: no tenderness or distention, no masses by palpation, no abnormal pulsatility or arterial bruits, normal bowel sounds, no hepatosplenomegaly Extremities: no clubbing, cyanosis or edema; 2+ radial, ulnar and brachial pulses bilaterally; 2+ right femoral, posterior tibial and dorsalis pedis pulses; 2+ left femoral, posterior tibial and dorsalis pedis pulses; no subclavian or femoral bruits Neurological: grossly nonfocal   EKG: Atrial paced, ventricular paced  Normal pacemaker  interrogation. Lower rate limit set at 70 beats per minute. Expected battery longevity around 24 months (minimum 11 months) 100 very brief mode switches, all around 30 seconds in duration, consistent with atrial tachycardia. Rare episodes of very brief nonsustained ventricular tachycardia, roughly once a month, lasting for only a few seconds. Normal lead parameters (R waves could not be tested due to complete heart block without escape rhythm)  Lipid Panel     Component Value Date/Time   CHOL  Value: 151        ATP III CLASSIFICATION:  <200     mg/dL   Desirable  200-239  mg/dL   Borderline Tucker  >=240    mg/dL   Tucker 01/09/2007 0500   TRIG 70 01/09/2007 0500   HDL 47 01/09/2007 0500   CHOLHDL 3.2 01/09/2007 0500   VLDL 14 01/09/2007 0500   LDLCALC  Value: 90        Total Cholesterol/HDL:CHD Risk Coronary Heart Disease Risk Table                     Men   Women  1/2 Average Risk   3.4   3.3 01/09/2007 0500    BMET    Component Value Date/Time   NA 136 10/27/2012 0555   K 3.8 10/27/2012 0555   CL 102 10/27/2012 0555   CO2 27 10/27/2012 0555   GLUCOSE 98 10/27/2012 0555   BUN 16 10/27/2012 0555   CREATININE 1.29 10/27/2012 0555   CALCIUM 8.9 10/27/2012 0555   GFRNONAA 47* 10/27/2012  0555   GFRAA 54* 10/27/2012 0555     ASSESSMENT AND PLAN Complete heart block Pacemaker dependent  Pacemaker - dual chamber Medtronic, 2008 Normal pacemaker interrogation. Lower rate limit set at 70 beats per minute. Expected battery longevity around 24 months (minimum 11 months) 100 very brief mode switches, all around 30 seconds in duration, consistent with atrial tachycardia. Rare episodes of very brief nonsustained ventricular tachycardia, roughly once a month, lasting for only a few seconds. Normal lead parameters (R waves could not be tested due to complete heart block without escape rhythm). Continue pacemaker monitoring every 3 months. It is very difficult for him to be transported to physicians appointments and I have suggested that we perform these checks be a remote monitoring. Once we get closer to device ERI, we will begin monthly checks and will be even more useful to be able to do the checks remotely. Although his quality of life appears to have substantially deteriorated, at this point in time his family appears firmly committed to continuing with advanced medical care and will wants to replace his battery when the time comes.  H/O CHF No complaints to suggest symptoms of heart failure, appears to be clinically euvolemic. Assessment functional status is impossible because of his extremely sedentary status  CAD s/p CABG 1999 No symptoms of acute coronary insufficiency. ECG not useful for risk stratification do to ventricular pacing. Family requested that we check his cholesterol level since he is no longer on any lipid lowering therapy. I think he is quite cachectic and is unlikely that we will recommend cholesterol-lowering medications but the request is not unreasonable.  Hypotension Low blood pressure today. Continue treatment with ProAmatine. Monitor for supine hypertension   Orders Placed This Encounter  Procedures  . Lipid Profile  . Comp Met (CMET)  . EKG 12-Lead    Meds ordered this encounter  Medications  . NEXIUM 40 MG capsule  Sig: Take 80 mg by mouth 2 (two) times daily.  Marland Kitchen CARAFATE 1 GM/10ML suspension    Sig: Take 2 g by mouth at bedtime.    Holli Humbles, MD, [place] [phone] office [phone] pager

## 2013-06-08 NOTE — Assessment & Plan Note (Signed)
Normal pacemaker interrogation. Lower rate limit set at 70 beats per minute. Expected battery longevity around 24 months (minimum 11 months) 100 very brief mode switches, all around 30 seconds in duration, consistent with atrial tachycardia. Rare episodes of very brief nonsustained ventricular tachycardia, roughly once a month, lasting for only a few seconds. Normal lead parameters (R waves could not be tested due to complete heart block without escape rhythm). Continue pacemaker monitoring every 3 months. It is very difficult for him to be transported to physicians appointments and I have suggested that we perform these checks be a remote monitoring. Once we get closer to device ERI, we will begin monthly checks and will be even more useful to be able to do the checks remotely. Although his quality of life appears to have substantially deteriorated, at this point in time his family appears firmly committed to continuing with advanced medical care and will wants to replace his battery when the time comes.

## 2013-06-08 NOTE — Assessment & Plan Note (Signed)
No symptoms of acute coronary insufficiency. ECG not useful for risk stratification do to ventricular pacing. Family requested that we check his cholesterol level since he is no longer on any lipid lowering therapy. I think he is quite cachectic and is unlikely that we will recommend cholesterol-lowering medications but the request is not unreasonable.

## 2013-06-08 NOTE — Assessment & Plan Note (Signed)
Pacemaker dependent 

## 2013-06-08 NOTE — Patient Instructions (Addendum)
Remote monitoring is used to monitor your pacemaker from home. This monitoring reduces the number of office visits required to check your device to one time per year. It allows [place] to keep an eye on the functioning of your device to ensure it is working properly. You are scheduled for a device check from home on 09-10-2013. You may send your transmission at any time that day. If you have a wireless device, the transmission will be sent automatically. After your physician reviews your transmission, you will receive a postcard with your next transmission date.  Your physician recommends that you return for lab work in: at your convenience FASTING.   Your physician recommends that you schedule a follow-up appointment in: 6 months with Dr.Croitoru

## 2013-06-08 NOTE — Assessment & Plan Note (Signed)
Low blood pressure today. Continue treatment with ProAmatine. Monitor for supine hypertension

## 2013-06-08 NOTE — Assessment & Plan Note (Signed)
No complaints to suggest symptoms of heart failure, appears to be clinically euvolemic. Assessment functional status is impossible because of his extremely sedentary status

## 2013-08-21 ENCOUNTER — Ambulatory Visit: Payer: Medicare Other | Admitting: Gastroenterology

## 2013-09-10 ENCOUNTER — Telehealth: Payer: Self-pay | Admitting: Cardiovascular Disease

## 2013-09-10 ENCOUNTER — Encounter: Payer: Medicare Other | Admitting: *Deleted

## 2013-09-10 NOTE — Telephone Encounter (Signed)
Is stating that the nursing home ([facility]) is asking for an order to check Douglas Tucker pacemaker, also is asking for a prescription for Midodrine. And that Dr.Weintraub always told him to take one if the bp is under 110 (the top number).. Please call if you have any questions..   Thanks

## 2013-09-10 NOTE — Telephone Encounter (Signed)
Daughter, Judeen Hammans, called requesting we send an order for Midodrine for hypotension and pacemaker interrogation transmission to [facility] - ask for Cecille Rubin or Grace Blight.  Called and spoke to [place] (his nurse) and they already have an order for Midodrine (BP this AM was 112/80) bid prn if systolic is < 465.  She does have a transmitter at the facility and will do a transmission.  Message sent to [facility] to call and work her through the process if she has a problem.

## 2013-09-21 ENCOUNTER — Encounter: Payer: Self-pay | Admitting: Cardiology

## 2013-10-08 DEATH — deceased

## 2014-02-19 ENCOUNTER — Encounter: Payer: Self-pay | Admitting: Cardiology

## 2014-03-12 ENCOUNTER — Telehealth: Payer: Self-pay | Admitting: Cardiovascular Disease

## 2014-03-12 NOTE — Telephone Encounter (Signed)
She wanted you to know he passed away on Oct 06, 2013.

## 2014-03-12 NOTE — Telephone Encounter (Signed)
Message routed to Dr. Sallyanne Kuster as Juluis Rainier

## 2014-03-22 ENCOUNTER — Telehealth: Payer: Self-pay

## 2014-03-22 NOTE — Telephone Encounter (Signed)
Patient died @ [facility] in [place] per [employer]
